# Patient Record
Sex: Male | Born: 1950 | Race: White | Hispanic: No | Marital: Married | Smoking: Never smoker
Health system: Southern US, Community
[De-identification: ages and names within clinical notes are randomized; demographics above are authoritative.]

## PROBLEM LIST (undated history)

## (undated) DIAGNOSIS — E785 Hyperlipidemia, unspecified: Secondary | ICD-10-CM

## (undated) DIAGNOSIS — I10 Essential (primary) hypertension: Secondary | ICD-10-CM

---

## 1997-12-20 ENCOUNTER — Ambulatory Visit (HOSPITAL_COMMUNITY): Admission: RE | Admit: 1997-12-20 | Discharge: 1997-12-20 | Payer: Self-pay | Admitting: Endocrinology

## 1998-07-21 ENCOUNTER — Encounter: Payer: Self-pay | Admitting: Emergency Medicine

## 1998-07-21 ENCOUNTER — Emergency Department (HOSPITAL_COMMUNITY): Admission: EM | Admit: 1998-07-21 | Discharge: 1998-07-21 | Payer: Self-pay | Admitting: Emergency Medicine

## 2003-10-06 ENCOUNTER — Ambulatory Visit (HOSPITAL_COMMUNITY): Admission: RE | Admit: 2003-10-06 | Discharge: 2003-10-06 | Payer: Self-pay | Admitting: Endocrinology

## 2011-08-24 ENCOUNTER — Inpatient Hospital Stay (HOSPITAL_COMMUNITY)
Admission: EM | Admit: 2011-08-24 | Discharge: 2011-08-26 | DRG: 141 | Disposition: A | Discharge status: Home / Self Care | Payer: BC Managed Care – PPO | Source: Ambulatory Visit | Attending: Internal Medicine | Admitting: Internal Medicine

## 2011-08-24 ENCOUNTER — Other Ambulatory Visit: Payer: Self-pay

## 2011-08-24 ENCOUNTER — Encounter (HOSPITAL_COMMUNITY): Payer: Self-pay | Admitting: *Deleted

## 2011-08-24 DIAGNOSIS — I10 Essential (primary) hypertension: Secondary | ICD-10-CM | POA: Diagnosis present

## 2011-08-24 DIAGNOSIS — E1039 Type 1 diabetes mellitus with other diabetic ophthalmic complication: Secondary | ICD-10-CM | POA: Diagnosis present

## 2011-08-24 DIAGNOSIS — E785 Hyperlipidemia, unspecified: Secondary | ICD-10-CM | POA: Diagnosis present

## 2011-08-24 DIAGNOSIS — R55 Syncope and collapse: Principal | ICD-10-CM | POA: Diagnosis present

## 2011-08-24 DIAGNOSIS — E860 Dehydration: Secondary | ICD-10-CM | POA: Diagnosis present

## 2011-08-24 DIAGNOSIS — R739 Hyperglycemia, unspecified: Secondary | ICD-10-CM

## 2011-08-24 DIAGNOSIS — Z7982 Long term (current) use of aspirin: Secondary | ICD-10-CM

## 2011-08-24 DIAGNOSIS — E109 Type 1 diabetes mellitus without complications: Secondary | ICD-10-CM | POA: Diagnosis present

## 2011-08-24 DIAGNOSIS — Z9641 Presence of insulin pump (external) (internal): Secondary | ICD-10-CM

## 2011-08-24 DIAGNOSIS — H409 Unspecified glaucoma: Secondary | ICD-10-CM | POA: Diagnosis present

## 2011-08-24 DIAGNOSIS — Z79899 Other long term (current) drug therapy: Secondary | ICD-10-CM

## 2011-08-24 DIAGNOSIS — E11319 Type 2 diabetes mellitus with unspecified diabetic retinopathy without macular edema: Secondary | ICD-10-CM | POA: Diagnosis present

## 2011-08-24 DIAGNOSIS — Z6838 Body mass index (BMI) 38.0-38.9, adult: Secondary | ICD-10-CM

## 2011-08-24 DIAGNOSIS — Z794 Long term (current) use of insulin: Secondary | ICD-10-CM

## 2011-08-24 DIAGNOSIS — E669 Obesity, unspecified: Secondary | ICD-10-CM | POA: Diagnosis present

## 2011-08-24 HISTORY — DX: Essential (primary) hypertension: I10

## 2011-08-24 HISTORY — DX: Hyperlipidemia, unspecified: E78.5

## 2011-08-24 LAB — GLUCOSE, CAPILLARY
Glucose-Capillary: 272 mg/dL — ABNORMAL HIGH (ref 70–99)
Glucose-Capillary: 233 mg/dL — ABNORMAL HIGH (ref 70–99)

## 2011-08-24 LAB — CBC
HCT: 35.7 % — ABNORMAL LOW (ref 39.0–52.0)
Hemoglobin: 12.5 g/dL — ABNORMAL LOW (ref 13.0–17.0)
MCH: 29.1 pg (ref 26.0–34.0)
MCHC: 35 g/dL (ref 30.0–36.0)
MCV: 83 fL (ref 78.0–100.0)
Platelets: 225 10*3/uL (ref 150–400)
RBC: 4.3 MIL/uL (ref 4.22–5.81)
RDW: 12.6 % (ref 11.5–15.5)
WBC: 12.7 10*3/uL — ABNORMAL HIGH (ref 4.0–10.5)

## 2011-08-24 LAB — BASIC METABOLIC PANEL
BUN: 14 mg/dL (ref 6–23)
CO2: 26 mEq/L (ref 19–32)
Calcium: 9 mg/dL (ref 8.4–10.5)
Chloride: 100 mEq/L (ref 96–112)
Creatinine, Ser: 0.73 mg/dL (ref 0.50–1.35)
GFR calc Af Amer: >90 mL/min (ref >90)
GFR calc non Af Amer: >90 mL/min (ref >90)
Glucose, Bld: 351 mg/dL — ABNORMAL HIGH (ref 70–99)
Potassium: 4.6 mEq/L (ref 3.5–5.1)
Sodium: 134 mEq/L — ABNORMAL LOW (ref 135–145)

## 2011-08-24 LAB — DIFFERENTIAL
Basophils Absolute: 0 10*3/uL (ref 0.0–0.1)
Basophils Relative: 0 % (ref 0–1)
Eosinophils Absolute: 0.1 10*3/uL (ref 0.0–0.7)
Eosinophils Relative: 1 % (ref 0–5)
Lymphocytes Relative: 8 % — ABNORMAL LOW (ref 12–46)
Lymphs Abs: 1 10*3/uL (ref 0.7–4.0)
Monocytes Absolute: 0.5 10*3/uL (ref 0.1–1.0)
Monocytes Relative: 4 % (ref 3–12)
Neutro Abs: 11.1 10*3/uL — ABNORMAL HIGH (ref 1.7–7.7)
Neutrophils Relative %: 87 % — ABNORMAL HIGH (ref 43–77)

## 2011-08-24 MED ORDER — BRIMONIDINE TARTRATE 0.2 % OP SOLN
1.0000 [drp] | Freq: Two times a day (BID) | OPHTHALMIC | Status: DC
  Administered 2011-08-25 – 2011-08-26 (×4): 1 [drp] via OPHTHALMIC
  Filled 2011-08-24: qty 5

## 2011-08-24 MED ORDER — INSULIN ASPART 100 UNIT/ML ~~LOC~~ SOLN
0.0000 [IU] | Freq: Three times a day (TID) | SUBCUTANEOUS | Status: DC
  Filled 2011-08-24: qty 3

## 2011-08-24 MED ORDER — SODIUM CHLORIDE 0.9 % IJ SOLN
3.0000 mL | Freq: Two times a day (BID) | INTRAMUSCULAR | Status: DC
  Administered 2011-08-25: 3 mL via INTRAVENOUS

## 2011-08-24 MED ORDER — METFORMIN HCL 500 MG PO TABS
1000.0000 mg | ORAL_TABLET | Freq: Two times a day (BID) | ORAL | Status: DC
  Administered 2011-08-24: 1000 mg via ORAL
  Filled 2011-08-24 (×2): qty 2

## 2011-08-24 MED ORDER — SODIUM CHLORIDE 0.9 % IV SOLN: INTRAVENOUS | Status: DC

## 2011-08-24 MED ORDER — INSULIN GLULISINE 100 UNIT/ML ~~LOC~~ SOLN: 1.0000 [IU] | Freq: Three times a day (TID) | SUBCUTANEOUS | Status: DC

## 2011-08-24 MED ORDER — OMEGA-3-ACID ETHYL ESTERS 1 G PO CAPS
1.0000 g | ORAL_CAPSULE | Freq: Two times a day (BID) | ORAL | Status: DC
  Administered 2011-08-24 – 2011-08-26 (×4): 1 g via ORAL
  Filled 2011-08-24 (×6): qty 1

## 2011-08-24 MED ORDER — LATANOPROST 0.005 % OP SOLN
1.0000 [drp] | Freq: Every day | OPHTHALMIC | Status: DC
  Administered 2011-08-24 – 2011-08-25 (×3): 1 [drp] via OPHTHALMIC
  Filled 2011-08-24: qty 2.5

## 2011-08-24 MED ORDER — INSULIN PUMP
Freq: Three times a day (TID) | SUBCUTANEOUS | Status: DC
  Administered 2011-08-25: 6 via SUBCUTANEOUS
  Administered 2011-08-25: 4 via SUBCUTANEOUS
  Administered 2011-08-25: 6 via SUBCUTANEOUS
  Administered 2011-08-25: 6.25 via SUBCUTANEOUS
  Administered 2011-08-26: 1.25 via SUBCUTANEOUS

## 2011-08-24 MED ORDER — SITAGLIPTIN-METFORMIN HCL 50-1000 MG PO TABS
1.0000 | ORAL_TABLET | Freq: Two times a day (BID) | ORAL | Status: DC
Start: 2011-08-24 — End: 2011-08-24

## 2011-08-24 MED ORDER — INSULIN ASPART 100 UNIT/ML ~~LOC~~ SOLN
8.0000 [IU] | Freq: Once | SUBCUTANEOUS | Status: AC
  Administered 2011-08-24: 8 [IU] via INTRAVENOUS

## 2011-08-24 MED ORDER — SODIUM CHLORIDE 0.9 % IV SOLN
INTRAVENOUS | Status: DC
  Administered 2011-08-24 – 2011-08-25 (×3): via INTRAVENOUS

## 2011-08-24 MED ORDER — METFORMIN HCL 500 MG PO TABS
1000.0000 mg | ORAL_TABLET | Freq: Two times a day (BID) | ORAL | Status: DC
  Administered 2011-08-25 – 2011-08-26 (×3): 1000 mg via ORAL
  Filled 2011-08-24 (×6): qty 2

## 2011-08-24 MED ORDER — PANTOPRAZOLE SODIUM 40 MG PO TBEC
40.0000 mg | DELAYED_RELEASE_TABLET | Freq: Every day | ORAL | Status: DC
  Administered 2011-08-25 – 2011-08-26 (×2): 40 mg via ORAL
  Filled 2011-08-24 (×2): qty 1

## 2011-08-24 MED ORDER — VITAMIN D (ERGOCALCIFEROL) 1.25 MG (50000 UNIT) PO CAPS
50000.0000 [IU] | ORAL_CAPSULE | ORAL | Status: DC
  Administered 2011-08-25: 50000 [IU] via ORAL
  Filled 2011-08-24: qty 1

## 2011-08-24 MED ORDER — SIMVASTATIN 40 MG PO TABS
40.0000 mg | ORAL_TABLET | Freq: Every day | ORAL | Status: DC
  Administered 2011-08-25: 40 mg via ORAL
  Filled 2011-08-24 (×2): qty 1

## 2011-08-24 MED ORDER — LINAGLIPTIN 5 MG PO TABS
5.0000 mg | ORAL_TABLET | Freq: Every day | ORAL | Status: DC
  Administered 2011-08-24 – 2011-08-26 (×3): 5 mg via ORAL
  Filled 2011-08-24 (×4): qty 1

## 2011-08-24 MED ORDER — TIMOLOL MALEATE 0.5 % OP SOLN
1.0000 [drp] | Freq: Two times a day (BID) | OPHTHALMIC | Status: DC
  Administered 2011-08-25 – 2011-08-26 (×3): 1 [drp] via OPHTHALMIC
  Filled 2011-08-24: qty 5

## 2011-08-24 MED ORDER — DEXTROSE 50 % IV SOLN
INTRAVENOUS | Status: AC
  Filled 2011-08-24: qty 50

## 2011-08-24 MED ORDER — SODIUM CHLORIDE 0.9 % IV SOLN: 250.0000 mL | INTRAVENOUS | Status: DC | PRN

## 2011-08-24 MED ORDER — SODIUM CHLORIDE 0.9 % IJ SOLN: 3.0000 mL | Freq: Two times a day (BID) | INTRAMUSCULAR | Status: DC

## 2011-08-24 MED ORDER — ENOXAPARIN SODIUM 40 MG/0.4ML ~~LOC~~ SOLN
40.0000 mg | SUBCUTANEOUS | Status: DC
  Administered 2011-08-24 – 2011-08-25 (×2): 40 mg via SUBCUTANEOUS
  Filled 2011-08-24 (×3): qty 0.4

## 2011-08-24 MED ORDER — BRINZOLAMIDE 1 % OP SUSP
1.0000 [drp] | Freq: Two times a day (BID) | OPHTHALMIC | Status: DC
  Administered 2011-08-25 (×2): 1 [drp] via OPHTHALMIC
  Filled 2011-08-24 (×3): qty 10

## 2011-08-24 MED ORDER — LINAGLIPTIN 5 MG PO TABS
5.0000 mg | ORAL_TABLET | Freq: Every day | ORAL | Status: DC
  Filled 2011-08-24: qty 1

## 2011-08-24 MED ORDER — ADULT MULTIVITAMIN W/MINERALS CH
1.0000 | ORAL_TABLET | Freq: Every day | ORAL | Status: DC
  Administered 2011-08-25 – 2011-08-26 (×2): 1 via ORAL
  Filled 2011-08-24 (×2): qty 1

## 2011-08-24 MED ORDER — BRIMONIDINE TARTRATE-TIMOLOL 0.2-0.5 % OP SOLN: 1.0000 [drp] | Freq: Two times a day (BID) | OPHTHALMIC | Status: DC

## 2011-08-24 MED ORDER — ASPIRIN EC 81 MG PO TBEC
81.0000 mg | DELAYED_RELEASE_TABLET | Freq: Every day | ORAL | Status: DC
  Administered 2011-08-25 – 2011-08-26 (×2): 81 mg via ORAL
  Filled 2011-08-24 (×2): qty 1

## 2011-08-24 MED ORDER — OLMESARTAN MEDOXOMIL 40 MG PO TABS
40.0000 mg | ORAL_TABLET | Freq: Every day | ORAL | Status: DC
  Administered 2011-08-25 – 2011-08-26 (×2): 40 mg via ORAL
  Filled 2011-08-24 (×2): qty 1

## 2011-08-24 MED ORDER — SODIUM CHLORIDE 0.9 % IJ SOLN: 3.0000 mL | INTRAMUSCULAR | Status: DC | PRN

## 2011-08-24 NOTE — ED Provider Notes (Signed)
History     CSN: [id number]  Arrival date & time 08/24/11  1107   First MD Initiated Contact with Patient 08/24/11 1113      Chief Complaint  Patient presents with  . Near Syncope    (Consider location/radiation/quality/duration/timing/severity/associated sxs/prior treatment) The history is provided by the patient and a parent.   the patient is a 61 year old male, with a history of diabetes.  He does not have a history of coronary artery disease, irregular heartbeat, congestive heart failure.  He denies smoking.  He denies a history of peptic ulcer disease.  He has not noted any blood in his stool, urine or from his gums or nose.  He rarely drinks alcohol and only had one beer last night.  He denies recent illness.  Today.  He got up and noted that his blood sugar was approximately 300.  He got nauseated, and he fainted.  He recovered almost immediately.  He got back in the bed and then tried to stand up.  Again, he fainted again.  He fainted with once again.  The patient denies nausea at this time.  He denies vomiting, as well.  He did not have any chest pain or pain anywhere else.  Prior to falling.  He denies palpitations.  Prior to falling.  He had no warning that he would faint except for very brief change in his vision, and then, he collapsed.  No past medical history on file.  No past surgical history on file.  No family history on file.  History  Substance Use Topics  . Smoking status: Not on file  . Smokeless tobacco: Not on file  . Alcohol Use: Not on file      Review of Systems  Constitutional: Negative for fever and chills.  HENT: Negative for nosebleeds and neck pain.   Eyes: Negative for visual disturbance.  Respiratory: Negative for cough, chest tightness and shortness of breath.   Cardiovascular: Negative for chest pain, palpitations and leg swelling.  Gastrointestinal: Positive for nausea and vomiting. Negative for abdominal pain and diarrhea.       2 episodes  of nausea and vomiting  Skin: Negative for pallor and rash.  Neurological: Positive for syncope. Negative for headaches.  Psychiatric/Behavioral: Negative for confusion.  All other systems reviewed and are negative.    Allergies  Review of patient's allergies indicates not on file.  Home Medications   Current Outpatient Rx  Name Route Sig Dispense Refill  . ASPIRIN EC 81 MG PO TBEC Oral Take 81 mg by mouth daily.    . ATORVASTATIN CALCIUM 20 MG PO TABS Oral Take 20 mg by mouth daily.    Marland Kitchen BRIMONIDINE TARTRATE-TIMOLOL 0.2-0.5 % OP SOLN Both Eyes Place 1 drop into both eyes every 12 (twelve) hours.    Marland Kitchen BRINZOLAMIDE 1 % OP SUSP Left Eye Place 1 drop into the left eye 2 (two) times daily.    Marland Kitchen ESOMEPRAZOLE MAGNESIUM 40 MG PO CPDR Oral Take 40 mg by mouth every other day.    . INSULIN GLULISINE 100 UNIT/ML [place] SOLN Subcutaneous Inject 1-16 Units into the skin 3 (three) times daily before meals. 65-75 units per day - on a sliding scale -  1 unit of insulin for every 25 points over 100.    Marland Kitchen LATANOPROST 0.005 % OP SOLN Both Eyes Place 1 drop into both eyes at bedtime.    . ADULT MULTIVITAMIN W/MINERALS CH Oral Take 1 tablet by mouth daily. Centrum silver    .  OMEGA-3-ACID ETHYL ESTERS 1 G PO CAPS Oral Take 1 g by mouth 2 (two) times daily.    Marland Kitchen SITAGLIPTIN-METFORMIN HCL 50-1000 MG PO TABS Oral Take 1 tablet by mouth 2 (two) times daily with a meal.    . VALSARTAN 320 MG PO TABS Oral Take 320 mg by mouth daily.    Marland Kitchen VITAMIN D (ERGOCALCIFEROL) 50000 UNITS PO CAPS Oral Take 50,000 Units by mouth every 14 (fourteen) days. Dose due February 3rd      BP 124/67  Pulse 92  Temp(Src) 97.9 F (36.6 C) (Oral)  Resp 18  SpO2 99%  Physical Exam  Constitutional: He is oriented to person, place, and time.       Morbidly obese  HENT:  Head: Normocephalic and atraumatic.  Eyes: Conjunctivae are normal. Pupils are equal, round, and reactive to light.  Neck: Normal range of motion. Neck supple.        No carotid bruits  Cardiovascular: Normal rate and regular rhythm.   No murmur heard. Pulmonary/Chest: Effort normal and breath sounds normal. No respiratory distress.  Abdominal: Soft. He exhibits no distension. There is no tenderness.  Musculoskeletal: Normal range of motion. He exhibits no edema and no tenderness.  Neurological: He is alert and oriented to person, place, and time. He has normal strength. He displays no tremor. No cranial nerve deficit or sensory deficit. He displays no seizure activity. Coordination normal. GCS eye subscore is 4. GCS verbal subscore is 5. GCS motor subscore is 6.  Skin: Skin is warm and dry.  Psychiatric: He has a normal mood and affect. His behavior is normal. Judgment and thought content normal.    ED Course  Procedures (including critical care time)  Labs Reviewed  CBC - Abnormal; Notable for the following:    WBC 12.7 (*)    Hemoglobin 12.5 (*)    HCT 35.7 (*)    All other components within normal limits  DIFFERENTIAL - Abnormal; Notable for the following:    Neutrophils Relative 87 (*)    Neutro Abs 11.1 (*)    Lymphocytes Relative 8 (*)    All other components within normal limits  BASIC METABOLIC PANEL   No results found.   No diagnosis found.  12:23 PM Spoke with triad.  He will admit.   MDM  Syncope 61 year old male, with no history of coronary disease, congestive heart failure or arrhythmia presents with 3 episodes of syncope today.  His symptoms, come, without warning.  Each time.  He thinks he has a rapid recovery of consciousness.  He has no symptoms to suggest an etiology for his syncope.  His physical examination is normal except for obesity.  No evidence of aortic stenosis.  There are no carotid bruits.  He's got a completely normal.  Neurological examination.        Nicholes Stairs, MD 08/24/11 [phone]

## 2011-08-24 NOTE — H&P (Signed)
PCP:  informs seeing Dr Tanja Port with [facility]   DOA:  08/24/2011 11:07 AM  Chief Complaint:  Syncope x 1 day  HPI: 61 y/o obese male with hx of type 1 DM on insulin pump with diabetic retinopathy, HL, presented with 3 episodes of  Syncope. Patient was in his usual state of health this morning when he woke up to go to the bathroom and synopsized for about 15 seconds. Denies any prodromal symptoms. He denies hitting his head. When he woke up, he was on the ground. He sat down for abut 45 minute and then got up to go to the bathroom and then again synopsized for about 15-20 seconds. He felt a little dizzy then and after few minutes tried to move around while sitting down but became nauseous and had an episode of vomiting. The 3rd episode was witnessed by his wife. EMS was called and as per patient and his wife his orthostatic BP  was checked but they aren't sure what it was. Patient denies any chest pain, palpitations, SOB, headache or weakness of limbs. Denies any seizure like activity. He informs feeling little dizzy . He denies any bowel  or urinary incontinence. Denies similar symptoms in past. Denies any bowel or urinary symptoms. informs having regular meals. His fsg when checked during these episodes were in high 200s . He informs having a beer last evening.denies any new mediations. Allergies: No Known Allergies  Prior to Admission medications   Medication Sig Start Date End Date Taking? Authorizing Provider  aspirin EC 81 MG tablet Take 81 mg by mouth daily.   Yes Historical Provider, MD  atorvastatin (LIPITOR) 20 MG tablet Take 20 mg by mouth daily.   Yes Historical Provider, MD  brimonidine-timolol (COMBIGAN) 0.2-0.5 % ophthalmic solution Place 1 drop into both eyes every 12 (twelve) hours.   Yes Historical Provider, MD  brinzolamide (AZOPT) 1 % ophthalmic suspension Place 1 drop into the left eye 2 (two) times daily.   Yes Historical Provider, MD  esomeprazole (NEXIUM) 40 MG capsule Take 40  mg by mouth every other day.   Yes Historical Provider, MD  insulin glulisine (APIDRA) 100 UNIT/ML injection Inject 1-16 Units into the skin 3 (three) times daily before meals. 65-75 units per day - on a sliding scale -  1 unit of insulin for every 25 points over 100.   Yes Historical Provider, MD  latanoprost (XALATAN) 0.005 % ophthalmic solution Place 1 drop into both eyes at bedtime.   Yes Historical Provider, MD  Multiple Vitamin (MULITIVITAMIN WITH MINERALS) TABS Take 1 tablet by mouth daily. Centrum silver   Yes Historical Provider, MD  omega-3 acid ethyl esters (LOVAZA) 1 G capsule Take 1 g by mouth 2 (two) times daily.   Yes Historical Provider, MD  sitaGLIPtan-metformin (JANUMET) 50-1000 MG per tablet Take 1 tablet by mouth 2 (two) times daily with a meal.   Yes Historical Provider, MD  valsartan (DIOVAN) 320 MG tablet Take 320 mg by mouth daily.   Yes Historical Provider, MD  Vitamin D, Ergocalciferol, (DRISDOL) 50000 UNITS CAPS Take 50,000 Units by mouth every 14 (fourteen) days. Dose due February 3rd   Yes Historical Provider, MD    Past Medical History  Diagnosis Date  . Diabetes mellitus   . Hypertension   . Glaucoma   . Hyperlipemia     No past surgical history on file.  Social History:  does not have a smoking history on file. He does not have any smokeless  tobacco history on file. He reports that he does not drink alcohol or use illicit drugs.  History reviewed. No pertinent family history.  Review of Systems:  Constitutional: Denies fever, chills, diaphoresis, appetite change and fatigue.  HEENT: Denies photophobia, eye pain, redness, hearing loss, ear pain, congestion, sore throat, rhinorrhea, sneezing, mouth sores, trouble swallowing, neck pain, neck stiffness and tinnitus.   Respiratory: Denies SOB, DOE, cough, chest tightness,  and wheezing.   Cardiovascular: Denies chest pain, palpitations and leg swelling.  Gastrointestinal: nausea+, vomiting+, denies  abdominal  pain, diarrhea, constipation, blood in stool and abdominal distention.  Genitourinary: Denies dysuria, urgency, frequency, hematuria, flank pain and difficulty urinating.  Musculoskeletal: Denies myalgias, back pain, joint swelling, arthralgias and gait problem.  Skin: Denies pallor, rash and wound.  Neurological: syncope, mild  dizziness, seizures, syncope, weakness, light-headedness, numbness and headaches.  Hematological: Denies adenopathy. Easy bruising, personal or family bleeding history  Psychiatric/Behavioral: Denies suicidal ideation, mood changes, confusion, nervousness, sleep disturbance and agitation   Physical Exam:  Filed Vitals:   08/24/11 1300 08/24/11 1315 08/24/11 1330 08/24/11 1405  BP: 120/70 113/72 149/78 133/75  Pulse: 96 99 101 101  Temp:    98 F (36.7 C)  TempSrc:    Oral  Resp: 20 10 16 18   Height:    5' 10.75" (1.797 m)  Weight:    122.471 kg (270 lb)  SpO2: 96% 96% 98% 96%    Constitutional: Vital signs reviewed. Obese male lying in bed in NAD HEENT: no pallor, moist oral mucosa, no LAD Cardiovascular: RRR, S1 normal, S2 normal, no MRG, pulses symmetric and intact bilaterally Pulmonary/Chest: CTAB, no wheezes, rales, or rhonchi Abdominal: Obese, Soft, Non-tender, non-distended, bowel sounds are normal, no masses, organomegaly, or guarding present.  GU: no CVA tenderness Musculoskeletal: No joint deformities, erythema, or stiffness, ROM full and no nontender Ext: no edema and no cyanosis, pulses palpable bilaterally (DP and PT) Hematology: no cervical, inginal, or axillary adenopathy.  Neurological: A&O x3, Strenght is normal and symmetric bilaterally, cranial nerve II-XII are grossly intact, no focal motor deficit, sensory intact to light touch bilaterally. cerebellar fn intact.  Skin: Warm, dry and intact. No rash, cyanosis, or clubbing.  Psychiatric: Normal mood and affect. speech and behavior is normal. Judgment and thought content normal. Cognition  and memory are normal.   Labs on Admission:  Results for orders placed during the hospital encounter of 08/24/11 (from the past 48 hour(s))  CBC     Status: Abnormal   Collection Time   08/24/11 11:15 AM      Component Value Range Comment   WBC 12.7 (*) 4.0 - 10.5 (K/uL)    RBC 4.30  4.22 - 5.81 (MIL/uL)    Hemoglobin 12.5 (*) 13.0 - 17.0 (g/dL)    HCT 95.2 (*) 84.1 - 52.0 (%)    MCV 83.0  78.0 - 100.0 (fL)    MCH 29.1  26.0 - 34.0 (pg)    MCHC 35.0  30.0 - 36.0 (g/dL)    RDW 32.4  40.1 - 02.7 (%)    Platelets 225  150 - 400 (K/uL)   DIFFERENTIAL     Status: Abnormal   Collection Time   08/24/11 11:15 AM      Component Value Range Comment   Neutrophils Relative 87 (*) 43 - 77 (%)    Neutro Abs 11.1 (*) 1.7 - 7.7 (K/uL)    Lymphocytes Relative 8 (*) 12 - 46 (%)    Lymphs Abs 1.0  0.7 - 4.0 (K/uL)    Monocytes Relative 4  3 - 12 (%)    Monocytes Absolute 0.5  0.1 - 1.0 (K/uL)    Eosinophils Relative 1  0 - 5 (%)    Eosinophils Absolute 0.1  0.0 - 0.7 (K/uL)    Basophils Relative 0  0 - 1 (%)    Basophils Absolute 0.0  0.0 - 0.1 (K/uL)   BASIC METABOLIC PANEL     Status: Abnormal   Collection Time   08/24/11 11:15 AM      Component Value Range Comment   Sodium 134 (*) 135 - 145 (mEq/L)    Potassium 4.6  3.5 - 5.1 (mEq/L)    Chloride 100  96 - 112 (mEq/L)    CO2 26  19 - 32 (mEq/L)    Glucose, Bld 351 (*) 70 - 99 (mg/dL)    BUN 14  6 - 23 (mg/dL)    Creatinine, Ser 0.93  0.50 - 1.35 (mg/dL)    Calcium 9.0  8.4 - 10.5 (mg/dL)    GFR calc non Af Amer >90  >90 (mL/min)    GFR calc Af Amer >90  >90 (mL/min)   GLUCOSE, CAPILLARY     Status: Abnormal   Collection Time   08/24/11  1:23 PM      Component Value Range Comment   Glucose-Capillary 272 (*) 70 - 99 (mg/dL)    Comment 1 Notify RN      Comment 2 Documented in Chart       Radiological Exams on Admission: No results found.  EKG: NSR@95 , RAD, no ST-T changes . No old  EKG to compare Assessment  Syncope and collapse   admit to telemetry  Unclear if orthostasis was truly negative, will recheck it EKG shows RAD otherwise wnl, HR in low 100s on tele with few PVCs, will check mg level Will check for cardiac enzyme  symptoms very likely due to dehydration or cardiogenic syncope.  Continue with IV fluids Will get 2D echo and monitor on tele for next 24 hrs and see for any abnormality PT eval Fall precautions  Diabetes mellitus type 1 uncontrolled  on insulin pump informs his A1C to be in above 10  will consult diabetic educator Send A1C and lipid panel Cont statin  Hypertension Switch diovan to benicar  DVT prophylaxis:  sq lovenox  Diet: diabetic  Time Spent on Admission: 45 minutes  Catlynn Grondahl 08/24/2011, 3:44 PM

## 2011-08-24 NOTE — Progress Notes (Signed)
2200 CBG per unit meter 91....glucose per pts implanted meter 108.  pt provided HS snack and glucose per implanted meter continued to drop.  Over span of 11/2 hour glucose down to 58 and pt became symptomatic with shaking. Pt given 1/2 amp D50 and glucose back up to 138 per unit meter.  Will recheck CBG and continue to monitor. Dierdre Highman

## 2011-08-24 NOTE — Progress Notes (Signed)
I admitted pt this afternoon from ED.  He has had the following of Apidra Insulin (from his insulin pump): at 12:11 (4 units), at 13:05 (4 units), at 16:00 (5 units), at 17:07 (3 units), at 18:48 (6 units).

## 2011-08-24 NOTE — ED Notes (Signed)
X 3 syncopal episodes this morning. Caught his fall each time. Pt. Been vomiting. cbg 270. Pt. Exp. X 1 syncopal episode with ems, which lasted 10 seconds.

## 2011-08-24 NOTE — ED Notes (Signed)
Pt. Has an insulin pump with continuous monitoring.

## 2011-08-25 ENCOUNTER — Encounter (HOSPITAL_COMMUNITY): Payer: Self-pay | Admitting: Cardiology

## 2011-08-25 ENCOUNTER — Other Ambulatory Visit: Payer: Self-pay

## 2011-08-25 DIAGNOSIS — R55 Syncope and collapse: Secondary | ICD-10-CM | POA: Diagnosis present

## 2011-08-25 LAB — LIPID PANEL
HDL: 38 mg/dL — ABNORMAL LOW (ref >39)
LDL Cholesterol: 34 mg/dL (ref 0–99)
Total CHOL/HDL Ratio: 2.6 RATIO
VLDL: 26 mg/dL (ref 0–40)

## 2011-08-25 LAB — GLUCOSE, CAPILLARY
Glucose-Capillary: 88 mg/dL (ref 70–99)
Glucose-Capillary: 101 mg/dL — ABNORMAL HIGH (ref 70–99)
Glucose-Capillary: 165 mg/dL — ABNORMAL HIGH (ref 70–99)

## 2011-08-25 NOTE — Consults (Signed)
Admit date: 08/24/2011 Referring Physician  Dr. Gonzella Lex Reason for Consultation  syncope  HPI: This is a 61yo male with history of DM, HTN and dylipidemia who presented to the ER with 3 syncopal episodes.  Apparently he had been in his USOH and got up to go to the bathroom and had syncope for about 15 seconds.  He denied any chest pain, SOB, palpitations, diaphoresis or nausea before the event.  When he woke up he found himself on the ground.  He says that he went into the next room and sat in a chair for about  45 minutes and then got up to go to the bathroom and had syncope for about 15 seconds again.  He then felt dizzy and tried to move around while sitting.  He then became nauseated and had vomiting.  He then passed out again and this episode was witnessed by his wife.  EMS was called.  He had no witnessed seizure activity.  His fasting BS was 200.  We are now asked to consult.     PMH:   Past Medical History  Diagnosis Date  . Diabetes mellitus   . Hypertension   . Glaucoma   . Hyperlipemia      PSH:  History reviewed. No pertinent past surgical history.  Allergies:  Review of patient's allergies indicates no known allergies. Prior to Admit Meds:   Prescriptions prior to admission  Medication Sig Dispense Refill  . aspirin EC 81 MG tablet Take 81 mg by mouth daily.      Marland Kitchen atorvastatin (LIPITOR) 20 MG tablet Take 20 mg by mouth daily.      . brimonidine-timolol (COMBIGAN) 0.2-0.5 % ophthalmic solution Place 1 drop into both eyes every 12 (twelve) hours.      . brinzolamide (AZOPT) 1 % ophthalmic suspension Place 1 drop into the left eye 2 (two) times daily.      Marland Kitchen esomeprazole (NEXIUM) 40 MG capsule Take 40 mg by mouth every other day.      . insulin glulisine (APIDRA) 100 UNIT/ML injection Inject 1-16 Units into the skin 3 (three) times daily before meals. 65-75 units per day - on a sliding scale -  1 unit of insulin for every 25 points over 100.      . latanoprost (XALATAN) 0.005 %  ophthalmic solution Place 1 drop into both eyes at bedtime.      . Multiple Vitamin (MULITIVITAMIN WITH MINERALS) TABS Take 1 tablet by mouth daily. Centrum silver      . omega-3 acid ethyl esters (LOVAZA) 1 G capsule Take 1 g by mouth 2 (two) times daily.      . sitaGLIPtan-metformin (JANUMET) 50-1000 MG per tablet Take 1 tablet by mouth 2 (two) times daily with a meal.      . valsartan (DIOVAN) 320 MG tablet Take 320 mg by mouth daily.      . Vitamin D, Ergocalciferol, (DRISDOL) 50000 UNITS CAPS Take 50,000 Units by mouth every 14 (fourteen) days. Dose due February 3rd       Fam HX:   History reviewed. No pertinent family history. Social HX:    History   Social History  . Marital Status: Married    Spouse Name: N/A    Number of Children: N/A  . Years of Education: N/A   Occupational History  . Not on file.   Social History Main Topics  . Smoking status: Never Smoker   . Smokeless tobacco: Not on file  . Alcohol  Use: No  . Drug Use: No  . Sexually Active:    Other Topics Concern  . Not on file   Social History Narrative  . No narrative on file     ROS:  All 11 ROS were addressed and are negative except what is stated in the HPI  Physical Exam: Blood pressure 135/76, pulse 94, temperature 98.2 F (36.8 C), temperature source Oral, resp. rate 18, height 5' 10.75" (1.797 m), weight 122.471 kg (270 lb), SpO2 93.00%.    General: Well developed, well nourished, in no acute distress Head: Eyes PERRLA, No xanthomas.   Normal cephalic and atramatic  Lungs:   Clear bilaterally to auscultation and percussion. Heart:   HRRR S1 S2 Pulses are 2+ & equal.            No carotid bruit. No JVD.  No abdominal bruits. No femoral bruits. Abdomen: Bowel sounds are positive, abdomen soft and non-tender without masses  Extremities:   No clubbing, cyanosis or edema.  DP +1 Neuro: Alert and oriented X 3. Psych:  Good affect, responds appropriately    Labs:   Lab Results  Component Value  Date   WBC 12.7* 08/24/2011   HGB 12.5* 08/24/2011   HCT 35.7* 08/24/2011   MCV 83.0 08/24/2011   PLT 225 08/24/2011    Lab 08/24/11 1115  NA 134*  K 4.6  CL 100  CO2 26  BUN 14  CREATININE 0.73  CALCIUM 9.0  PROT --  BILITOT --  ALKPHOS --  ALT --  AST --  GLUCOSE 351*     Lab Results  Component Value Date   TROPONINI <0.30 08/24/2011     Lab Results  Component Value Date   CHOL 98 08/25/2011   Lab Results  Component Value Date   HDL 38* 08/25/2011   Lab Results  Component Value Date   LDLCALC 34 08/25/2011   Lab Results  Component Value Date   TRIG 132 08/25/2011   Lab Results  Component Value Date   CHOLHDL 2.6 08/25/2011   No results found for this basename: LDLDIRECT      Radiology:  NONE EKG:  NSR with normal QTc  ASSESSMENT:  1.  Syncope x 3 - sounds orthostatic, patient was mildly tachycardic on admit.  EKG nonischemic and troponin negative. 2.  DM 3.  HTN  PLAN:   1.  Check 2D echo to eval LVF - if normal then would d/c home on [employer] heart monitor for 4 weeks and schedule outpatient stress cardiolyte to rule out ischemia 2.  Check carotid arterial dopplers Quintella Reichert, MD  08/25/2011  11:25 AM

## 2011-08-25 NOTE — Progress Notes (Signed)
Subjective: Patient seen and examined this am. No overnight issues  Objective:  Vital signs in last 24 hours:  Filed Vitals:   08/24/11 1907 08/24/11 2100 08/25/11 0600 08/25/11 1349  BP: 148/82 115/68 135/76 130/74  Pulse:  86 94 90  Temp:  97.8 F (36.6 C) 98.2 F (36.8 C) 98 F (36.7 C)  TempSrc:    Oral  Resp:  14 18 19   Height:      Weight:      SpO2:  95% 93% 97%    Intake/Output from previous day:   Intake/Output Summary (Last 24 hours) at 08/25/11 1638 Last data filed at 08/25/11 1500  Gross per 24 hour  Intake   1750 ml  Output      0 ml  Net   1750 ml    Physical Exam:  Obese male lying in bed in NAD  HEENT: no pallor, moist oral mucosa, no LAD  Cardiovascular: RRR, S1 normal, S2 normal, no MRG, pulses symmetric and intact bilaterally  Pulmonary/Chest: CTAB, no wheezes, rales, or rhonchi  Abdominal: Obese, Soft, Non-tender, non-distended, bowel sounds are normal, no masses, organomegaly, or guarding present.  Ext: no edema and no cyanosis, pulses palpable bilaterally (DP and PT)  Neurological: A&O x3, Strenght is normal and symmetric bilaterally, cranial nerve II-XII are grossly intact, no focal motor deficit, sensory intact to light touch bilaterally. cerebellar fn intact.      Lab Results:  Basic Metabolic Panel:    Component Value Date/Time   NA 134* 08/24/2011 1115   K 4.6 08/24/2011 1115   CL 100 08/24/2011 1115   CO2 26 08/24/2011 1115   BUN 14 08/24/2011 1115   CREATININE 0.73 08/24/2011 1115   GLUCOSE 351* 08/24/2011 1115   CALCIUM 9.0 08/24/2011 1115   CBC:    Component Value Date/Time   WBC 12.7* 08/24/2011 1115   HGB 12.5* 08/24/2011 1115   HCT 35.7* 08/24/2011 1115   PLT 225 08/24/2011 1115   MCV 83.0 08/24/2011 1115   NEUTROABS 11.1* 08/24/2011 1115   LYMPHSABS 1.0 08/24/2011 1115   MONOABS 0.5 08/24/2011 1115   EOSABS 0.1 08/24/2011 1115   BASOSABS 0.0 08/24/2011 1115    No results found for this or any previous visit (from the past 240  hour(s)).  Studies/Results: No results found.  Medications: Scheduled Meds:   . aspirin EC  81 mg Oral Daily  . timolol  1 drop Both Eyes BID   And  . brimonidine  1 drop Both Eyes BID  . brinzolamide  1 drop Left Eye BID  . dextrose      . enoxaparin  40 mg Subcutaneous Q24H  . insulin pump   Subcutaneous TID AC, HS, 0200  . latanoprost  1 drop Both Eyes QHS  . metFORMIN  1,000 mg Oral BID WC   And  . linagliptin  5 mg Oral Q breakfast  . mulitivitamin with minerals  1 tablet Oral Daily  . olmesartan  40 mg Oral Daily  . omega-3 acid ethyl esters  1 g Oral BID  . pantoprazole  40 mg Oral Q1200  . simvastatin  40 mg Oral q1800  . sodium chloride  3 mL Intravenous Q12H  . Vitamin D (Ergocalciferol)  50,000 Units Oral Q14 Days  . DISCONTD: insulin aspart  0-20 Units Subcutaneous TID WC  . DISCONTD: insulin glulisine  1-16 Units Subcutaneous TID AC  . DISCONTD: linagliptin  5 mg Oral Q breakfast  . DISCONTD: metFORMIN  1,000  mg Oral BID WC   Continuous Infusions:   . sodium chloride 100 mL/hr at 08/24/11 1650   PRN Meds:.sodium chloride, sodium chloride Assessment  61 y/o obese male with hx of type 1 DM on insulin pump with diabetic retinopathy, HL, presented with 3 episodes of Syncope   PLAN: Syncope and collapse  admit to telemetry  orthostatis negative EKG shows RAD otherwise wnl, HR in low 100s on tele with few PVCs,  Cardiac enzyme negative symptoms very likely due to dehydration or cardiogenic syncope.  Continue with IV fluids for now continue tele. 2D echo and carotid doppler Appreciate cardiology eval. Recommend to d/c home if  LVF wnl  on lifewatch heart monitor for 4 weeks and schedule outpatient stress  to rule out ischemia PT eval   Diabetes mellitus type 1  uncontrolled  on insulin pump  informs his A1C to be in above 10  will consult diabetic educator  check A1C and lipid panel  Cont statin   Hypertension  Cont benicar  DVT prophylaxis:  sq  lovenox  Diet: diabetic    LOS: 1 day   Manuel Sullivan 08/25/2011, 4:38 PM

## 2011-08-26 DIAGNOSIS — E669 Obesity, unspecified: Secondary | ICD-10-CM | POA: Diagnosis present

## 2011-08-26 DIAGNOSIS — R55 Syncope and collapse: Secondary | ICD-10-CM

## 2011-08-26 DIAGNOSIS — E109 Type 1 diabetes mellitus without complications: Secondary | ICD-10-CM | POA: Diagnosis present

## 2011-08-26 LAB — GLUCOSE, CAPILLARY
Glucose-Capillary: 90 mg/dL (ref 70–99)
Glucose-Capillary: 116 mg/dL — ABNORMAL HIGH (ref 70–99)

## 2011-08-26 MED ORDER — INSULIN PUMP
Freq: Three times a day (TID) | SUBCUTANEOUS | Status: DC
  Filled 2011-08-26: qty 1

## 2011-08-26 NOTE — Progress Notes (Signed)
08/26/2011 [facility], Bosie Clos SPARKS Case Management Note [phone]    CARE MANAGEMENT NOTE 08/26/2011  Patient:  ZACHARI, ALBERTA   Account Number:  [id number]  Date Initiated:  08/26/2011  Documentation initiated by:  Fransico Michael  Subjective/Objective Assessment:   admitted on 08/24/11 with c/o syncope     Action/Plan:   prior to admission, patient lived at home with family support. Independent with ADLs   Anticipated DC Date:  08/26/2011   Anticipated DC Plan:  HOME/SELF CARE      DC Planning Services  CM consult      Choice offered to / List presented to:             Status of service:  Completed, signed off Medicare Important Message given?   (If response is "NO", the following Medicare IM given date fields will be blank) Date Medicare IM given:   Date Additional Medicare IM given:    Discharge Disposition:  HOME/SELF CARE  Per UR Regulation:  Reviewed for med. necessity/level of care/duration of stay  Comments:  07/26/11- [id number]-J.Lutricia Horsfall  [phone]      61yo male patient admitted on 08/24/11 with c/o syncope. Prior to admission, patient lived at home with family support. Independent with ADLs. In to see patient. To be discharged home today with self care. No discharge needs identified.

## 2011-08-26 NOTE — Progress Notes (Addendum)
Patient refused 1200 CBG check.  Will continue to monitor. Manuel Sullivan Patient now allowing finger glucose sticks.  Will continue to monitor. Manuel Sullivan

## 2011-08-26 NOTE — Progress Notes (Signed)
SUBJECTIVE:  No further syncope, wants to go home  OBJECTIVE:   Vitals:   Filed Vitals:   08/25/11 0600 08/25/11 1349 08/25/11 2100 08/26/11 0500  BP: 135/76 130/74 121/62 125/71  Pulse: 94 90 86 84  Temp: 98.2 F (36.8 C) 98 F (36.7 C) 98.7 F (37.1 C) 98.9 F (37.2 C)  TempSrc:  Oral Oral Oral  Resp: 18 19 18 18   Height:      Weight:      SpO2: 93% 97% 95% 95%   I&O's:   Intake/Output Summary (Last 24 hours) at 08/26/11 1340 Last data filed at 08/26/11 0900  Gross per 24 hour  Intake    920 ml  Output      0 ml  Net    920 ml   TELEMETRY: Reviewed telemetry pt in NSR with no arrhythmias     PHYSICAL EXAM General: Well developed, well nourished, in no acute distress Head: Eyes PERRLA, No xanthomas.   Normal cephalic and atramatic  Lungs:   Clear bilaterally to auscultation and percussion. Heart:   HRRR S1 S2 Pulses are 2+ & equal.            No carotid bruit. No JVD.  No abdominal bruits. No femoral bruits. Abdomen: Bowel sounds are positive, abdomen soft and non-tender without masses or                  Hernia's noted. Msk:  Back normal, normal gait. Normal strength and tone for age. Extremities:   No clubbing, cyanosis or edema.  DP +1 Neuro: Alert and oriented X 3. Psych:  Good affect, responds appropriately   LABS: Basic Metabolic Panel:  Basename 08/24/11 1115  NA 134*  K 4.6  CL 100  CO2 26  GLUCOSE 351*  BUN 14  CREATININE 0.73  CALCIUM 9.0  MG --  PHOS --   CBC:  Basename 08/24/11 1115  WBC 12.7*  NEUTROABS 11.1*  HGB 12.5*  HCT 35.7*  MCV 83.0  PLT 225   Cardiac Enzymes:  Basename 08/24/11 1600  CKTOTAL --  CKMB --  CKMBINDEX --  TROPONINI <0.30   Hemoglobin A1C:  Basename 08/24/11 1630  HGBA1C 9.8*   Fasting Lipid Panel:  Basename 08/25/11 0704  CHOL 98  HDL 38*  LDLCALC 34  TRIG 161  CHOLHDL 2.6  LDLDIRECT --   RADIOLOGY: No results found.    ASSESSMENT:  1. Syncope x 3 - sounds orthostatic, patient was  mildly tachycardic on admit. EKG nonischemic and troponin negative.  2. DM  3. HTN   PLAN:   1.  2D echo reviewed and LVF normal.  OK to d/c home from cardiac standpoint. 2. Will have my office call patient to schedule a stress cardiolyte in our office 3.  My office will place a lifewatch monitor on patient before d/c 4.  Followup in 2 weeks with my NP  Quintella Reichert, MD  08/26/2011  1:40 PM

## 2011-08-26 NOTE — Progress Notes (Signed)
  Echocardiogram 2D Echocardiogram has been performed.  Manuel Sullivan Lynn [name] 08/26/2011, 1:10 PM

## 2011-08-26 NOTE — Progress Notes (Signed)
DC orders received.  Patient stable with no S/S of distress.  DC and medication information reviewed with patient and wife.  Patient DC home with wife. Nolon Nations

## 2011-08-26 NOTE — Discharge Summary (Signed)
Patient ID: Manuel Sullivan MRN: [id number] DOB/AGE: 04/05/1951 61 y.o.  Admit date: 08/24/2011 Discharge date: 08/26/2011  Primary Care Physician:  Berenda Morale, MD, MD  Discharge Diagnoses:    Principal Problem:  *Syncope  Active Problems:  Diabetes mellitus type 1, uncomplicated  Obesity   Medication List  As of 08/26/2011  1:44 PM   TAKE these medications         aspirin EC 81 MG tablet   Take 81 mg by mouth daily.      atorvastatin 20 MG tablet   Commonly known as: LIPITOR   Take 20 mg by mouth daily.      brinzolamide 1 % ophthalmic suspension   Commonly known as: AZOPT   Place 1 drop into the left eye 2 (two) times daily.      COMBIGAN 0.2-0.5 % ophthalmic solution   Generic drug: brimonidine-timolol   Place 1 drop into both eyes every 12 (twelve) hours.      esomeprazole 40 MG capsule   Commonly known as: NEXIUM   Take 40 mg by mouth every other day.      insulin glulisine 100 UNIT/ML injection   Commonly known as: APIDRA   Inject 1-16 Units into the skin 3 (three) times daily before meals. 65-75 units per day - on a sliding scale -  1 unit of insulin for every 25 points over 100.      latanoprost 0.005 % ophthalmic solution   Commonly known as: XALATAN   Place 1 drop into both eyes at bedtime.      mulitivitamin with minerals Tabs   Take 1 tablet by mouth daily. Centrum silver      omega-3 acid ethyl esters 1 G capsule   Commonly known as: LOVAZA   Take 1 g by mouth 2 (two) times daily.      sitaGLIPtan-metformin 50-1000 MG per tablet   Commonly known as: JANUMET   Take 1 tablet by mouth 2 (two) times daily with a meal.      valsartan 320 MG tablet   Commonly known as: DIOVAN   Take 320 mg by mouth daily.      Vitamin D (Ergocalciferol) 50000 UNITS Caps   Commonly known as: DRISDOL   Take 50,000 Units by mouth every 14 (fourteen) days. Dose due February 3rd            Disposition and Follow-up:  Follow up with PCP in 1 week' follow up  with your endocrinologist ( [place]) as outpatient Follow up in cardiology clinic on 3/4 at 9:10 am   Consults:   Armanda Magic ( cardiology)  Significant Diagnostic Studies:  No results found.  Brief H and P: For complete details please refer to admission H and P, but in brief 61 y/o obese male with hx of type 1 DM on insulin pump with diabetic retinopathy, HL, presented with 3 episodes of Syncope. Patient was in his usual state of health this morning when he woke up to go to the bathroom and synopsized for about 15 seconds. Denies any prodromal symptoms. He denies hitting his head. When he woke up, he was on the ground. He sat down for abut 45 minute and then got up to go to the bathroom and then again synopsized for about 15-20 seconds. He felt a little dizzy then and after few minutes tried to move around while sitting down but became nauseous and had an episode of vomiting. The 3rd episode was witnessed by his wife. EMS  was called and as per patient and his wife his orthostatic BP was checked but they aren't sure what it was. Patient denies any chest pain, palpitations, SOB, headache or weakness of limbs. Denies any seizure like activity. He informs feeling little dizzy . He denies any bowel or urinary incontinence. Denies similar symptoms in past. Denies any bowel or urinary symptoms. informs having regular meals. His fsg when checked during these episodes were in high 200s . He informs having a beer last evening.denies any new mediations.   Physical Exam on Discharge:  Filed Vitals:   08/25/11 0600 08/25/11 1349 08/25/11 2100 08/26/11 0500  BP: 135/76 130/74 121/62 125/71  Pulse: 94 90 86 84  Temp: 98.2 F (36.8 C) 98 F (36.7 C) 98.7 F (37.1 C) 98.9 F (37.2 C)  TempSrc:  Oral Oral Oral  Resp: 18 19 18 18   Height:      Weight:      SpO2: 93% 97% 95% 95%     Intake/Output Summary (Last 24 hours) at 08/26/11 1344 Last data filed at 08/26/11 0900  Gross per 24 hour  Intake     920 ml  Output      0 ml  Net    920 ml      CBC:    Component Value Date/Time   WBC 12.7* 08/24/2011 1115   HGB 12.5* 08/24/2011 1115   HCT 35.7* 08/24/2011 1115   PLT 225 08/24/2011 1115   MCV 83.0 08/24/2011 1115   NEUTROABS 11.1* 08/24/2011 1115   LYMPHSABS 1.0 08/24/2011 1115   MONOABS 0.5 08/24/2011 1115   EOSABS 0.1 08/24/2011 1115   BASOSABS 0.0 08/24/2011 1115    Basic Metabolic Panel:    Component Value Date/Time   NA 134* 08/24/2011 1115   K 4.6 08/24/2011 1115   CL 100 08/24/2011 1115   CO2 26 08/24/2011 1115   BUN 14 08/24/2011 1115   CREATININE 0.73 08/24/2011 1115   GLUCOSE 351* 08/24/2011 1115   CALCIUM 9.0 08/24/2011 1115    Hospital Course:   Syncope and collapse  admitted  to telemetry  orthostatis negative  EKG shows RAD otherwise wnl, HR in low 100s on tele with few PVCs,  Cardiac enzyme negative  symptoms very likely due to dehydration or cardiogenic syncope.  Patient seen by cardiology consult ( Dr Mayford Knife) 2D echo shows normal LVEF and carotid doppler wnl  cardiology recommend to d/c home  On lifewatch heart monitor for 4 weeks and schedule outpatient stress to rule out ischemia .   Diabetes mellitus type 1  uncontrolled  on insulin pump  A1C of 9.8 this admission Cont statin   Hypertension  Stable  Patient will be dsicharged on life watch monitor for 4 weeks ( has been placed by cardiology prior to discharge) He will be called from cardiology clinic for outpatient follow up and stress test.   Time spent on Discharge: 45 minutes  Signed: [name] 08/26/2011, 1:44 PM

## 2011-08-26 NOTE — Progress Notes (Signed)
*  PRELIMINARY RESULTS* Vascular Ultrasound Carotid Duplex (Doppler) has been completed.  Preliminary findings: Bilaterally no significant ICA stenosis with antegrade vertebral flow.  Farrel Demark RDMS 08/26/2011, 12:50 PM

## 2014-03-01 ENCOUNTER — Ambulatory Visit (INDEPENDENT_AMBULATORY_CARE_PROVIDER_SITE_OTHER): Payer: BC Managed Care – PPO

## 2014-03-01 VITALS — BP 129/65 | HR 79 | Resp 17

## 2014-03-01 DIAGNOSIS — E1142 Type 2 diabetes mellitus with diabetic polyneuropathy: Secondary | ICD-10-CM

## 2014-03-01 DIAGNOSIS — L988 Other specified disorders of the skin and subcutaneous tissue: Secondary | ICD-10-CM

## 2014-03-01 DIAGNOSIS — E1140 Type 2 diabetes mellitus with diabetic neuropathy, unspecified: Secondary | ICD-10-CM

## 2014-03-01 DIAGNOSIS — R234 Changes in skin texture: Secondary | ICD-10-CM

## 2014-03-01 DIAGNOSIS — B353 Tinea pedis: Secondary | ICD-10-CM

## 2014-03-01 DIAGNOSIS — E1149 Type 2 diabetes mellitus with other diabetic neurological complication: Secondary | ICD-10-CM

## 2014-03-01 MED ORDER — SILVER SULFADIAZINE 1 % EX CREA: 1.0000 "application " | TOPICAL_CREAM | Freq: Every day | CUTANEOUS | Status: AC

## 2014-03-01 NOTE — Patient Instructions (Signed)
ANTIBACTERIAL SOAP INSTRUCTIONS  THE DAY AFTER PROCEDURE  Please follow the instructions your doctor has marked.   Shower as usual. Before getting out, place a drop of antibacterial liquid soap (Dial) on a wet, clean washcloth.  Gently wipe washcloth over affected area.  Afterward, rinse the area with warm water.  Blot the area dry with a soft cloth and cover with antibiotic ointment (neosporin, polysporin, bacitracin) and band aid or gauze and tape  Place 3-4 drops of antibacterial liquid soap in a quart of warm tap water.  Submerge foot into water for 20 minutes.  If bandage was applied after your procedure, leave on to allow for easy lift off, then remove and continue with soak for the remaining time.  Next, blot area dry with a soft cloth and cover with a bandage.  Apply other medications as directed by your doctor, such as cortisporin otic solution (eardrops) or neosporin antibiotic ointment  Wash or bathe the foot regularly with soap and water, also ulcer antibacterial. And afterwards apply either Neosporin or the prescribed Silvadene cream to the areas of fissuring skin  Also maintain twice daily application of Lotrimin cream to both feet

## 2014-03-01 NOTE — Progress Notes (Signed)
   Subjective:    Patient ID: Manuel PandaJohn K Sullivan, male    DOB: 08-19-1950, 63 y.o.   MRN: [id number]  HPI  pT PRESENTS WITH BILATERAL DRY SCALEY FEET, LEFT FOOT HAS CRACKED OPEN WOUND ON THE PLANTAR REGION, PT HAS BEEN TREATING WITH NEOSPORIN TID, AND LOTRIMIN AND AQUAPHOR BID WITH SOCKS, STATES THAT CRACKS IN FEET ARE PAINFUL AND KEEP HIM UP AT NIGHT, THEY HAVE PROGRESSIVELY GOTTEN WORSE. PT IS TYPE I AND TYPE II DIABETIC , HE IS ON A CONTINUOUS MONITORING SYSTEM WITH INSULIN PUMP TO CONTROL BLOOD GLUCOSE. HAS HAD  HOSPITALIZATION FOR DKA IN January 2015.   Review of Systems  Eyes: Positive for visual disturbance.  Skin: Positive for wound.  Neurological: Positive for numbness.  All other systems reviewed and are negative.      Objective:   Physical Exam 63 year old white male well-developed well-nourished oriented x3 presents at this time with a history of fissuring and an erythematous rash are peeling skin bottom of both feet left is fissuring the right has just peeling skin. Has been treating with Neosporin pain formula for the fissuring skin has been applying Lotrimin to the bottom of the right foot both feet of improved slightly he also using Aquaphor cream for the Morton for the dry skin. Although there is improvement not complete resolution at this time. Next the left lower extremity objective findings reveal neurovascular status is intact pedal pulses palpable DP postal for PT plus one over 4 capillary refill time 3 seconds epicritic and proprioceptive sensations intact and symmetric bilateral there is normal plantar response DTRs not elicited neurologically skin color pigment normal hair growth absent there is a fishing skin with a macular rash plantar aspect of both feet possibly consistent with a tinea pedis distribution. There is no interdigital fissuring of her patient is not having itching pruritus are that may be associated with a neuropathy reducing sensation. Orthopedic exam  otherwise unremarkable noncontributory no secondary infections no       Assessment & Plan:  Assessment this time is diabetes with history peripheral neuropathy possibly causing some dehydration fissuring the skin continue with Aquaphor cream also continued low wet Lotrimin cream to both feet notches the right foot. Will also use a spray antifungal for his shoes and socks as needed to help reduce reinfecting of fungus and suggested prescription for Silvadene cream to the fissuring skin to peel the small fissures on the bottom of the foot. Reappointed one month if it fails to resolve or improve contact us immediately if any exacerbation of symptoms.  Alvan Dameichard Leodis Alcocer DPM

## 2014-03-29 ENCOUNTER — Ambulatory Visit: Payer: BC Managed Care – PPO

## 2015-01-30 ENCOUNTER — Ambulatory Visit
Admission: RE | Admit: 2015-01-30 | Discharge: 2015-01-30 | Disposition: A | Discharge status: Home / Self Care | Payer: BLUE CROSS/BLUE SHIELD | Source: Ambulatory Visit | Attending: Physical Medicine and Rehabilitation | Admitting: Physical Medicine and Rehabilitation

## 2015-01-30 ENCOUNTER — Other Ambulatory Visit: Payer: Self-pay | Admitting: Physical Medicine and Rehabilitation

## 2015-01-30 DIAGNOSIS — G8929 Other chronic pain: Secondary | ICD-10-CM

## 2015-01-30 DIAGNOSIS — M545 Low back pain, unspecified: Secondary | ICD-10-CM

## 2015-02-17 ENCOUNTER — Ambulatory Visit
Admission: RE | Admit: 2015-02-17 | Discharge: 2015-02-17 | Disposition: A | Discharge status: Home / Self Care | Payer: BLUE CROSS/BLUE SHIELD | Source: Ambulatory Visit | Attending: Physical Medicine and Rehabilitation | Admitting: Physical Medicine and Rehabilitation

## 2015-02-17 ENCOUNTER — Other Ambulatory Visit: Payer: Self-pay | Admitting: Physical Medicine and Rehabilitation

## 2015-02-17 DIAGNOSIS — S93401A Sprain of unspecified ligament of right ankle, initial encounter: Secondary | ICD-10-CM

## 2015-08-08 ENCOUNTER — Encounter: Payer: Self-pay | Admitting: Podiatry

## 2015-08-08 ENCOUNTER — Ambulatory Visit (INDEPENDENT_AMBULATORY_CARE_PROVIDER_SITE_OTHER): Payer: BLUE CROSS/BLUE SHIELD | Admitting: Podiatry

## 2015-08-08 ENCOUNTER — Telehealth: Payer: Self-pay | Admitting: *Deleted

## 2015-08-08 VITALS — BP 141/61 | HR 84 | Resp 16

## 2015-08-08 DIAGNOSIS — R0989 Other specified symptoms and signs involving the circulatory and respiratory systems: Secondary | ICD-10-CM | POA: Diagnosis not present

## 2015-08-08 DIAGNOSIS — E1142 Type 2 diabetes mellitus with diabetic polyneuropathy: Secondary | ICD-10-CM

## 2015-08-08 NOTE — Patient Instructions (Signed)
Today your diabetic foot examination demonstrated decreased pulsations in the right and left feet. I am ordering a lower extremity arterial Doppler to evaluate this further and we will notify you of the results There is decreased feeling in your feet consistent with diabetic peripheral neuropathy Scaling on the bottom the right foot more consistent with dry skin and I recommend daily application of a moisturizing cream or ointment. Also, at night if the scaling increases use Saran wrap at a sock when sleeping and remove the following day. Repeat until scaling improves  Diabetes and Foot Care Diabetes may cause you to have problems because of poor blood supply (circulation) to your feet and legs. This may cause the skin on your feet to become thinner, break easier, and heal more slowly. Your skin may become dry, and the skin may peel and crack. You may also have nerve damage in your legs and feet causing decreased feeling in them. You may not notice minor injuries to your feet that could lead to infections or more serious problems. Taking care of your feet is one of the most important things you can do for yourself.  HOME CARE INSTRUCTIONS  Wear shoes at all times, even in the house. Do not go barefoot. Bare feet are easily injured.  Check your feet daily for blisters, cuts, and redness. If you cannot see the bottom of your feet, use a mirror or ask someone for help.  Wash your feet with warm water (do not use hot water) and mild soap. Then pat your feet and the areas between your toes until they are completely dry. Do not soak your feet as this can dry your skin.  Apply a moisturizing lotion or petroleum jelly (that does not contain alcohol and is unscented) to the skin on your feet and to dry, brittle toenails. Do not apply lotion between your toes.  Trim your toenails straight across. Do not dig under them or around the cuticle. File the edges of your nails with an emery board or nail file.  Do  not cut corns or calluses or try to remove them with medicine.  Wear clean socks or stockings every day. Make sure they are not too tight. Do not wear knee-high stockings since they may decrease blood flow to your legs.  Wear shoes that fit properly and have enough cushioning. To break in new shoes, wear them for just a few hours a day. This prevents you from injuring your feet. Always look in your shoes before you put them on to be sure there are no objects inside.  Do not cross your legs. This may decrease the blood flow to your feet.  If you find a minor scrape, cut, or break in the skin on your feet, keep it and the skin around it clean and dry. These areas may be cleansed with mild soap and water. Do not cleanse the area with peroxide, alcohol, or iodine.  When you remove an adhesive bandage, be sure not to damage the skin around it.  If you have a wound, look at it several times a day to make sure it is healing.  Do not use heating pads or hot water bottles. They may burn your skin. If you have lost feeling in your feet or legs, you may not know it is happening until it is too late.  Make sure your health care provider performs a complete foot exam at least annually or more often if you have foot problems. Report any  cuts, sores, or bruises to your health care provider immediately. SEEK MEDICAL CARE IF:   You have an injury that is not healing.  You have cuts or breaks in the skin.  You have an ingrown nail.  You notice redness on your legs or feet.  You feel burning or tingling in your legs or feet.  You have pain or cramps in your legs and feet.  Your legs or feet are numb.  Your feet always feel cold. SEEK IMMEDIATE MEDICAL CARE IF:   There is increasing redness, swelling, or pain in or around a wound.  There is a red line that goes up your leg.  Pus is coming from a wound.  You develop a fever or as directed by your health care provider.  You notice a bad smell  coming from an ulcer or wound.   This information is not intended to replace advice given to you by your health care provider. Make sure you discuss any questions you have with your health care provider.   Document Released: 07/05/2000 Document Revised: 03/10/2013 Document Reviewed: 12/15/2012 Elsevier Interactive Patient Education [employer]

## 2015-08-08 NOTE — Progress Notes (Signed)
   Subjective:    Patient ID: Manuel Sullivan, male    DOB: 03-Jun-1951, 65 y.o.   MRN: [id number]  HPI This patient presents today requesting a diabetic foot examination. He denies any history of claudication, ulceration or amputation. He relates a history of ongoing dry skin on the plantar aspect of the right foot which he applies daily moisturizing medication He Was last evaluated in office on 03/01/2014       Review of Systems  All other systems reviewed and are negative.      Objective:   Physical Exam  Orientated 3  Vascular: No calf pain or calf edema bilaterally DP pulses 1/4 bilaterally PT pulses 0/4 right 2/4 left Capillary reflex immediate bilaterally  Neurological: Sensation to 10 g monofilament wire intact 1/5 bilaterally Vibratory sensation nonreactive bilaterally Ankle reflex equal and reactive bilaterally  Dermatological No open skin lesions bilaterally Plantar scaling right foot without any inflammatory changes  Musculoskeletal: Hammertoe second right No restriction ankle, subtalar, midtarsal joints bilaterally      Assessment & Plan:   Assessment: Decrease pedal pulses Hammertoe second right Diabetic peripheral neuropathy Right dry scaling skin right  Plan: Today review the results of examination with patient today and made aware that his pedal pulses were diminished or difficult to palpate. I'm referring to the vascular lab for lower extremity arterial Doppler for the indication of diabetic with decreased pedal pulses  Patient also advised he has significant decrease sensation right and left feet Obtain moisturizing to dry skin on the plantar S right foot daily  Notify patient of results of lower extremity arterial Doppler

## 2015-08-08 NOTE — Telephone Encounter (Signed)
Dr. Leeanne Deed ordered ABI with TBI and arterial dopplers B/L diminished pulses.  Faxed to [facility].

## 2015-08-22 ENCOUNTER — Ambulatory Visit (HOSPITAL_COMMUNITY)
Admission: RE | Admit: 2015-08-22 | Discharge: 2015-08-22 | Disposition: A | Discharge status: Home / Self Care | Payer: BLUE CROSS/BLUE SHIELD | Source: Ambulatory Visit | Attending: Internal Medicine | Admitting: Internal Medicine

## 2015-08-22 DIAGNOSIS — I10 Essential (primary) hypertension: Secondary | ICD-10-CM | POA: Diagnosis not present

## 2015-08-22 DIAGNOSIS — E785 Hyperlipidemia, unspecified: Secondary | ICD-10-CM | POA: Insufficient documentation

## 2015-08-22 DIAGNOSIS — R0989 Other specified symptoms and signs involving the circulatory and respiratory systems: Secondary | ICD-10-CM | POA: Diagnosis not present

## 2015-08-22 DIAGNOSIS — E119 Type 2 diabetes mellitus without complications: Secondary | ICD-10-CM | POA: Insufficient documentation

## 2015-08-29 NOTE — Telephone Encounter (Addendum)
-----   Message from Carrington Clamp, DPM sent at 08/29/2015  8:23 AM EST ----- Lower extremity arterial Doppler dated 08/22/2015  No evidence of segmental lower extremity arterial disease at rest bilaterally  Notify patient vascular exam was within normal limits and needs no follow-up  08/29/2015-DR. TUCHMAN reviewed 08/22/2015 dopplers as within normal limits.  Left message informing pt of Dr. Theotis Burrow statement.

## 2016-07-03 IMAGING — CR DG ANKLE COMPLETE 3+V*R*
4 series · 4 of 4 positions shown · non-contrast
Comparison: None in PACs

CLINICAL DATA: Twisting injury of the right ankle 2 weeks ago with
persistent generalized ankle pain and swelling.

EXAM:
RIGHT ANKLE - COMPLETE 3+ VIEW

[t ankle joint ap right]
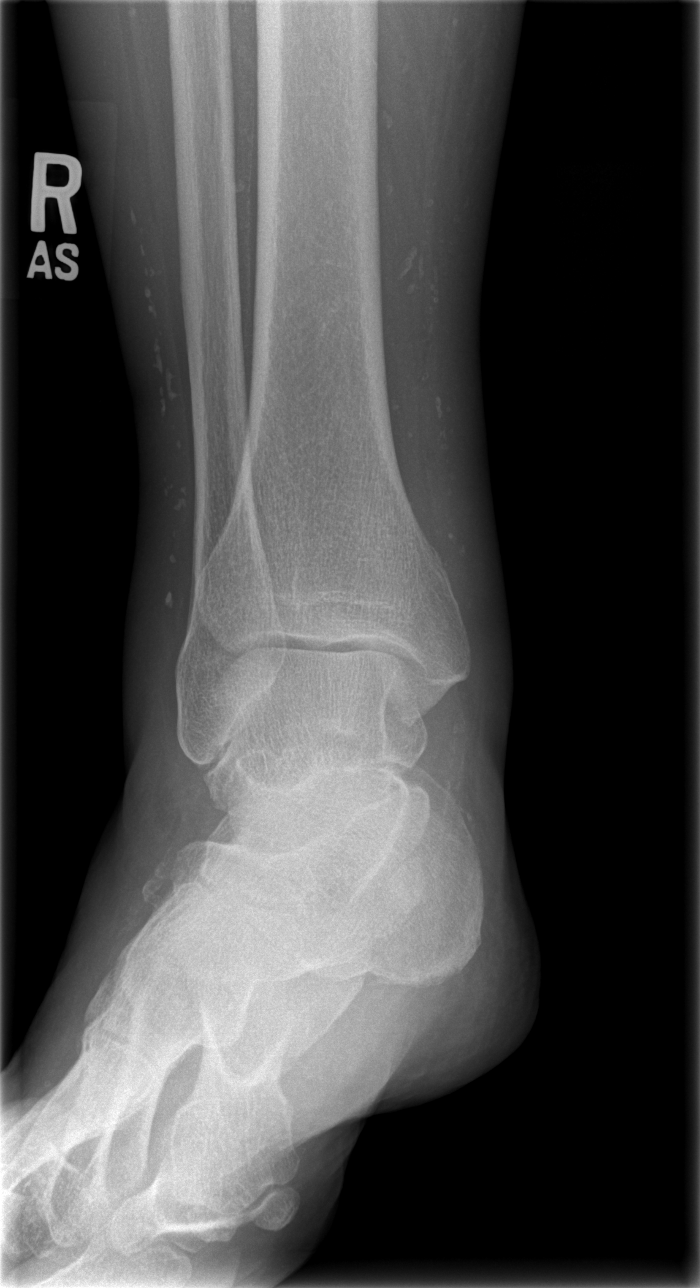

[t ankle joint oblique right (1 of 2)]
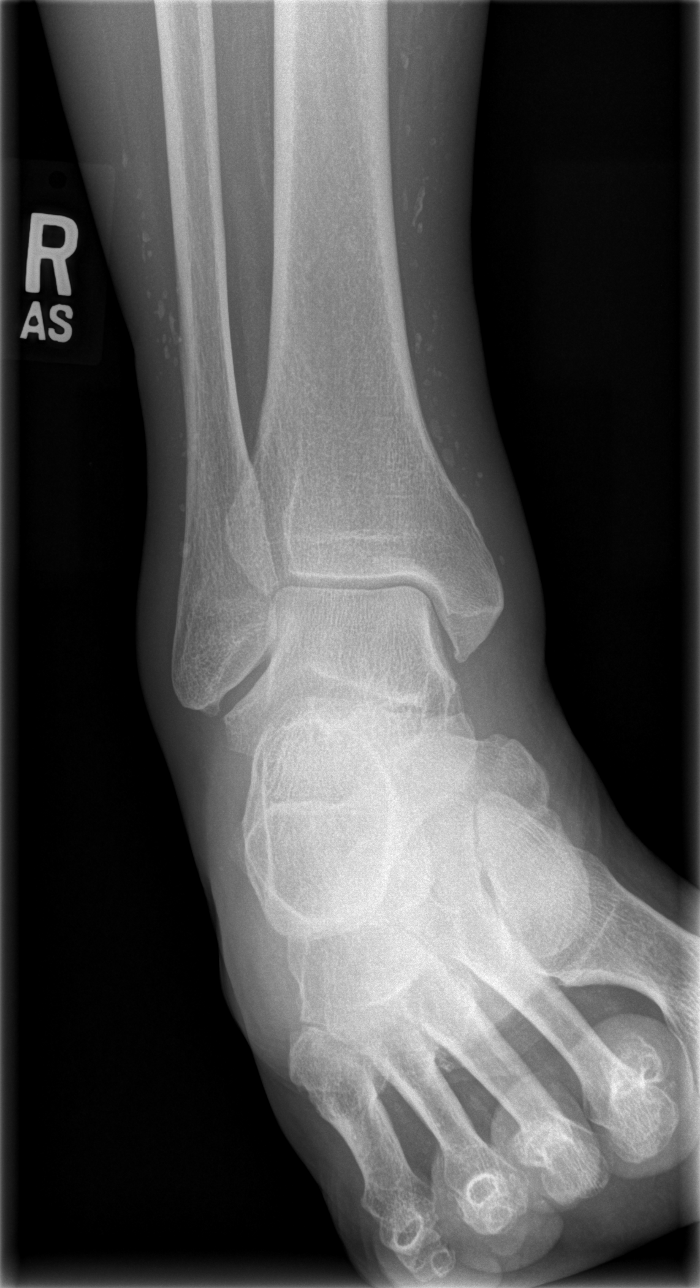

[t ankle joint lat right]
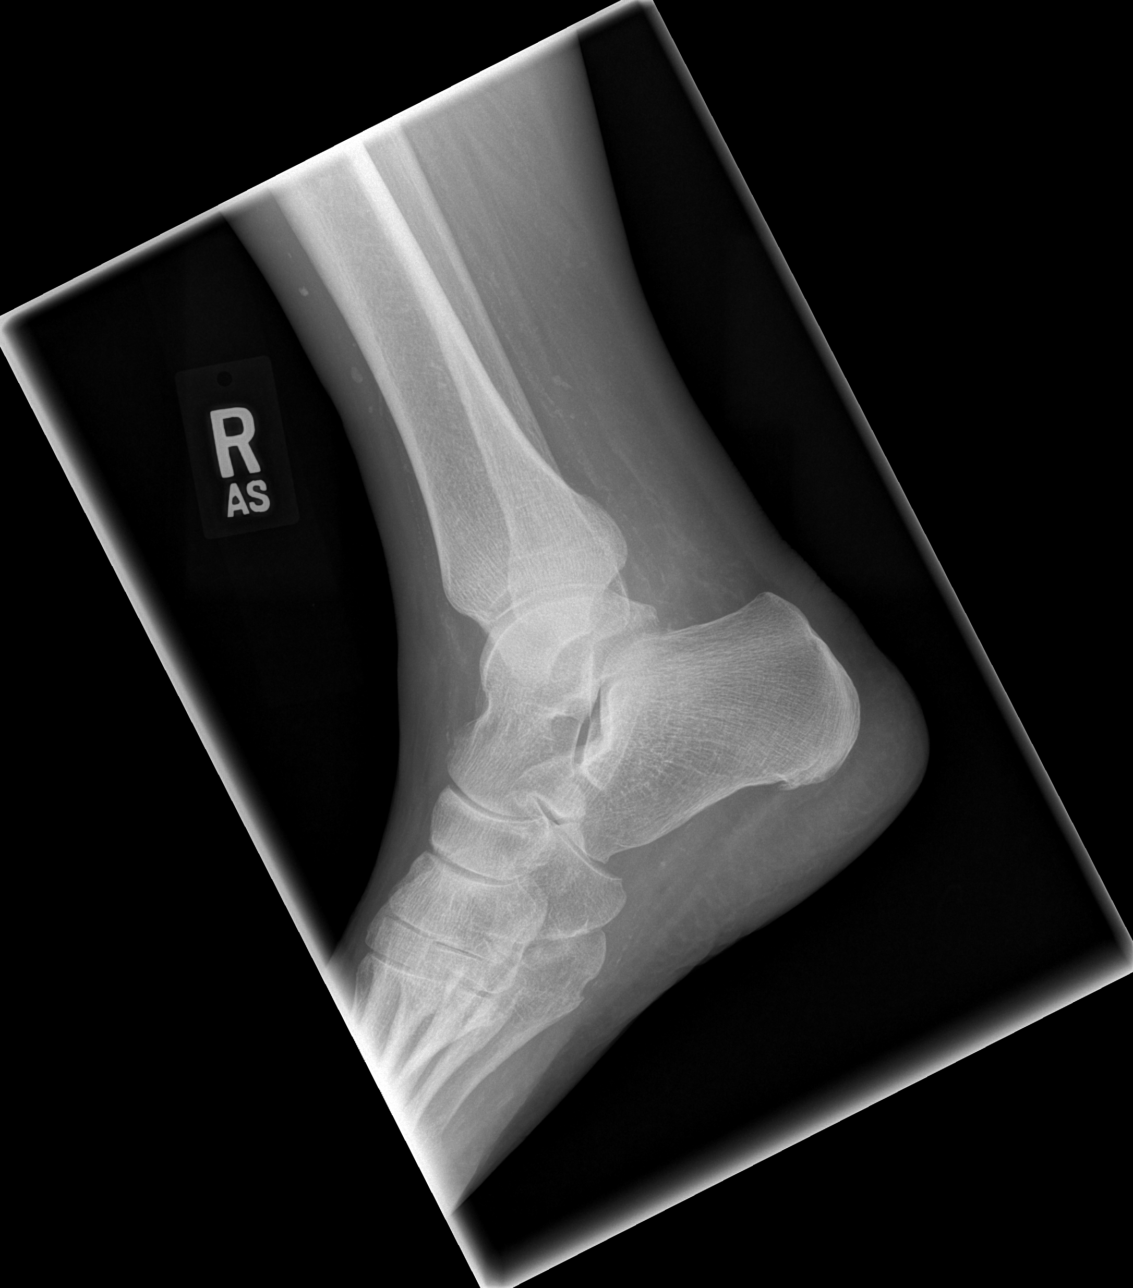

[t ankle joint oblique right (2 of 2)]
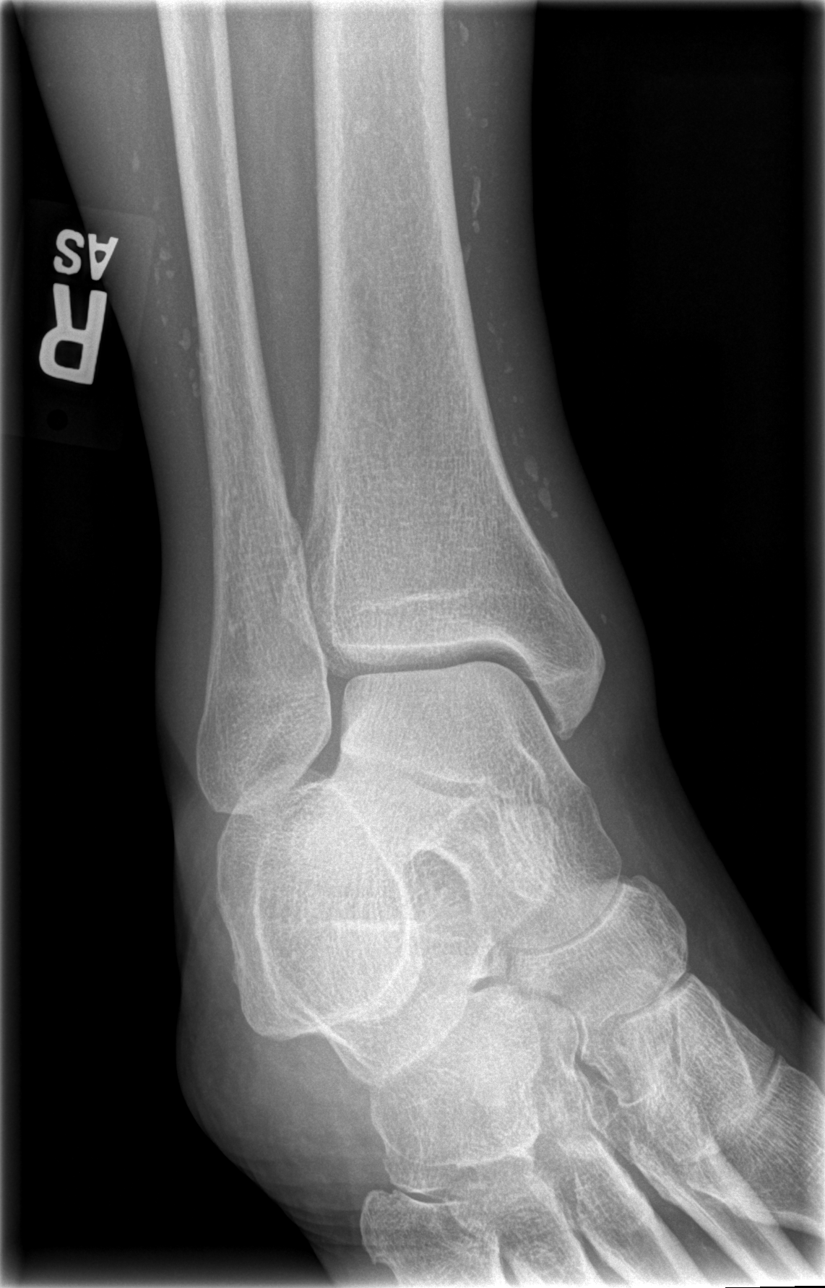

[4 of 4 positions shown; findings below may reference images not displayed]

FINDINGS: The ankle joint mortise is preserved. The talar dome is intact.
There is no acute malleolar fracture. There is no fracture evident
elsewhere. There is a small plantar calcaneal spur. There is mild
diffuse soft tissue swelling. There are phleboliths and arterial
calcifications.
IMPRESSION: There is no acute or significant chronic bony abnormality of the
right ankle.

## 2016-08-07 ENCOUNTER — Ambulatory Visit: Payer: BLUE CROSS/BLUE SHIELD | Admitting: Podiatry

## 2016-08-21 ENCOUNTER — Encounter: Payer: Self-pay | Admitting: Podiatry

## 2016-08-21 ENCOUNTER — Ambulatory Visit (INDEPENDENT_AMBULATORY_CARE_PROVIDER_SITE_OTHER): Payer: BLUE CROSS/BLUE SHIELD | Admitting: Podiatry

## 2016-08-21 VITALS — BP 147/72 | HR 92 | Resp 18

## 2016-08-21 DIAGNOSIS — L853 Xerosis cutis: Secondary | ICD-10-CM | POA: Diagnosis not present

## 2016-08-21 DIAGNOSIS — E1142 Type 2 diabetes mellitus with diabetic polyneuropathy: Secondary | ICD-10-CM

## 2016-08-21 NOTE — Progress Notes (Signed)
Patient ID: Manuel Sullivan, male   DOB: [birth year], 66 y.o.   MRN: [id number]   Subjective: This patient presents again for follow-up visit of 08/08/2015. At that time and today he again he still complains of a continuous dry scaling in intermittent fissuring on the plantar aspect of his right and left feet. Patient uses ongoing moisturizing creams or ointments and went fissures develop uses Neosporin ointment. Patient has used Tinactin spray for a long period of time. In spite of ongoing moisturizing and topical antifungal treatment the persistent scaling has continued. Patient is diabetic and denies any history of foot ulceration, claudication or amputation  Review of systems: Positive for dry scaling skin All other systems negative  Objective: Orientated 3 DP and PT pulses 1/4 bilaterally Capillary reflex immediate bilaterally  Neurological: Sensation to 10 g monofilament wire intact 0/5 right and 1/5 left Vibratory sensation reactive bilaterally Ankle reflexes reactive bilaterally  Dermatological: No open skin lesions bilaterally Plantar dry scaling erythematous skin bilaterally  Musculoskeletal: Hammertoe second bilaterally Manual motor testing dorsi flexion, plantar flexion, inversion, eversion 5/5 bilaterally  Assessment: Diabetic with peripheral neuropathy Diminished pedal pulses bilaterally Chronic scaling skin undetermined etiology  Plan: Today I reviewed the results with the patient today and told him that I could not give him a definitive cause of the chronic scaling it has not responded to daily moisturizing and topical antifungal medication. I instructed patient to contact a dermatologist to obtain a specific diagnosis and treatment for his ongoing chronic problem. In the meantime I encouraged patient to maintain moisturizing to his feet on a daily basis  Return as needed or yearly for diabetic foot screen

## 2016-08-21 NOTE — Patient Instructions (Signed)
Please consult with a dermatologist to obtain a definitive diagnosis for the chronic dry scaling skin that is not responding to continuous use of moisturizing agents and antifungal medication  Diabetes and Foot Care Diabetes may cause you to have problems because of poor blood supply (circulation) to your feet and legs. This may cause the skin on your feet to become thinner, break easier, and heal more slowly. Your skin may become dry, and the skin may peel and crack. You may also have nerve damage in your legs and feet causing decreased feeling in them. You may not notice minor injuries to your feet that could lead to infections or more serious problems. Taking care of your feet is one of the most important things you can do for yourself. Follow these instructions at home:  Wear shoes at all times, even in the house. Do not go barefoot. Bare feet are easily injured.  Check your feet daily for blisters, cuts, and redness. If you cannot see the bottom of your feet, use a mirror or ask someone for help.  Wash your feet with warm water (do not use hot water) and mild soap. Then pat your feet and the areas between your toes until they are completely dry. Do not soak your feet as this can dry your skin.  Apply a moisturizing lotion or petroleum jelly (that does not contain alcohol and is unscented) to the skin on your feet and to dry, brittle toenails. Do not apply lotion between your toes.  Trim your toenails straight across. Do not dig under them or around the cuticle. File the edges of your nails with an emery board or nail file.  Do not cut corns or calluses or try to remove them with medicine.  Wear clean socks or stockings every day. Make sure they are not too tight. Do not wear knee-high stockings since they may decrease blood flow to your legs.  Wear shoes that fit properly and have enough cushioning. To break in new shoes, wear them for just a few hours a day. This prevents you from injuring  your feet. Always look in your shoes before you put them on to be sure there are no objects inside.  Do not cross your legs. This may decrease the blood flow to your feet.  If you find a minor scrape, cut, or break in the skin on your feet, keep it and the skin around it clean and dry. These areas may be cleansed with mild soap and water. Do not cleanse the area with peroxide, alcohol, or iodine.  When you remove an adhesive bandage, be sure not to damage the skin around it.  If you have a wound, look at it several times a day to make sure it is healing.  Do not use heating pads or hot water bottles. They may burn your skin. If you have lost feeling in your feet or legs, you may not know it is happening until it is too late.  Make sure your health care provider performs a complete foot exam at least annually or more often if you have foot problems. Report any cuts, sores, or bruises to your health care provider immediately. Contact a health care provider if:  You have an injury that is not healing.  You have cuts or breaks in the skin.  You have an ingrown nail.  You notice redness on your legs or feet.  You feel burning or tingling in your legs or feet.  You have  pain or cramps in your legs and feet.  Your legs or feet are numb.  Your feet always feel cold. Get help right away if:  There is increasing redness, swelling, or pain in or around a wound.  There is a red line that goes up your leg.  Pus is coming from a wound.  You develop a fever or as directed by your health care provider.  You notice a bad smell coming from an ulcer or wound. This information is not intended to replace advice given to you by your health care provider. Make sure you discuss any questions you have with your health care provider. Document Released: 07/05/2000 Document Revised: 12/14/2015 Document Reviewed: 12/15/2012 Elsevier Interactive Patient Education  2017 [employer].

## 2017-08-21 ENCOUNTER — Ambulatory Visit: Payer: BLUE CROSS/BLUE SHIELD | Admitting: Podiatry

## 2017-09-03 ENCOUNTER — Encounter: Payer: Self-pay | Admitting: Podiatry

## 2017-09-03 ENCOUNTER — Ambulatory Visit: Payer: BLUE CROSS/BLUE SHIELD | Admitting: Podiatry

## 2017-09-03 DIAGNOSIS — L853 Xerosis cutis: Secondary | ICD-10-CM

## 2017-09-03 DIAGNOSIS — E1142 Type 2 diabetes mellitus with diabetic polyneuropathy: Secondary | ICD-10-CM | POA: Diagnosis not present

## 2017-09-03 DIAGNOSIS — M2040 Other hammer toe(s) (acquired), unspecified foot: Secondary | ICD-10-CM

## 2017-09-03 NOTE — Progress Notes (Signed)
Subjective:   Patient ID: Manuel PandaJohn K Sullivan, male   DOB: 67 y.o.   MRN: [id number]   HPI Long-term 55-year history diabetes is under reasonable control with profound neuropathy and hammertoe deformity with history of diabetic shoes   ROS      Objective:  Physical Exam  Neurovascular status unchanged from previous visit with patient having moderate vascular disease of which I questioned him extensively and he states that he has had vascular studies done and nothing has changed since     Assessment:  At risk diabetic with profound neuropathy vascular disease with digital deformities and history of diabetic shoes     Plan:  Discussed that we may at one point consider further vascular studies but since he is stable I do not recommend further that perspective and everything else looks good with no open ulcerations with hallux buckling bilateral.  We will get approval for new diabetic shoes for this year as they have helped to prevent problems and he has not developed any ulcerations

## 2017-09-09 ENCOUNTER — Other Ambulatory Visit: Payer: BLUE CROSS/BLUE SHIELD | Admitting: Orthotics

## 2017-09-16 ENCOUNTER — Ambulatory Visit (INDEPENDENT_AMBULATORY_CARE_PROVIDER_SITE_OTHER): Payer: BLUE CROSS/BLUE SHIELD | Admitting: Orthotics

## 2017-09-16 DIAGNOSIS — M79676 Pain in unspecified toe(s): Secondary | ICD-10-CM

## 2017-09-16 DIAGNOSIS — M2040 Other hammer toe(s) (acquired), unspecified foot: Secondary | ICD-10-CM

## 2017-09-16 DIAGNOSIS — E1142 Type 2 diabetes mellitus with diabetic polyneuropathy: Secondary | ICD-10-CM

## 2017-09-16 NOTE — Progress Notes (Signed)
Patient came in today for 2 pair of diabetic shoes (non medicare) and 1 pair of custom accomodative f/o.    Patient agrees to self pay 150.00 for shoes/each pair; also insurance to be filed for the L3020 accomodatives.

## 2017-10-21 ENCOUNTER — Telehealth: Payer: Self-pay | Admitting: Podiatry

## 2017-10-21 NOTE — Telephone Encounter (Signed)
Pt left voicemail that he got a bill for the diabetic shoes but has not received them yet. He is asking for status of them.   I returned call and left message, we do have the shoes just waiting on the inserts to come in and I will call when they do come in and he can just pay then.

## 2017-10-28 NOTE — Telephone Encounter (Signed)
Shoes and inserts in and pt notified and will call to schedule an appt when he is at his calendar.

## 2017-11-13 ENCOUNTER — Ambulatory Visit (INDEPENDENT_AMBULATORY_CARE_PROVIDER_SITE_OTHER): Payer: BLUE CROSS/BLUE SHIELD | Admitting: Orthotics

## 2017-11-13 DIAGNOSIS — E1142 Type 2 diabetes mellitus with diabetic polyneuropathy: Secondary | ICD-10-CM

## 2017-11-13 DIAGNOSIS — E109 Type 1 diabetes mellitus without complications: Secondary | ICD-10-CM

## 2017-11-14 NOTE — Progress Notes (Signed)

## 2020-04-04 ENCOUNTER — Other Ambulatory Visit: Payer: Self-pay

## 2020-04-04 ENCOUNTER — Ambulatory Visit (INDEPENDENT_AMBULATORY_CARE_PROVIDER_SITE_OTHER): Payer: Medicare Other | Admitting: Ophthalmology

## 2020-04-04 ENCOUNTER — Encounter (INDEPENDENT_AMBULATORY_CARE_PROVIDER_SITE_OTHER): Payer: Self-pay | Admitting: Ophthalmology

## 2020-04-04 DIAGNOSIS — Z961 Presence of intraocular lens: Secondary | ICD-10-CM | POA: Insufficient documentation

## 2020-04-04 DIAGNOSIS — H4720 Unspecified optic atrophy: Secondary | ICD-10-CM | POA: Diagnosis not present

## 2020-04-04 DIAGNOSIS — H401133 Primary open-angle glaucoma, bilateral, severe stage: Secondary | ICD-10-CM | POA: Diagnosis not present

## 2020-04-04 DIAGNOSIS — E113553 Type 2 diabetes mellitus with stable proliferative diabetic retinopathy, bilateral: Secondary | ICD-10-CM | POA: Diagnosis not present

## 2020-04-04 DIAGNOSIS — H47293 Other optic atrophy, bilateral: Secondary | ICD-10-CM | POA: Insufficient documentation

## 2020-04-04 DIAGNOSIS — Z794 Long term (current) use of insulin: Secondary | ICD-10-CM

## 2020-04-04 DIAGNOSIS — H3550 Unspecified hereditary retinal dystrophy: Secondary | ICD-10-CM

## 2020-04-04 NOTE — Assessment & Plan Note (Signed)
As per Dr Leonard Schwartz Lottie Dawson

## 2020-04-04 NOTE — Progress Notes (Signed)
04/04/2020     CHIEF COMPLAINT Patient presents for Retina Follow Up   HISTORY OF PRESENT ILLNESS: Manuel Sullivan is a 69 y.o. male who presents to the clinic today for:   HPI    Retina Follow Up    Patient presents with  Diabetic Retinopathy.  In both eyes.  This started 1 year ago.  Severity is mild.  Duration of 1 year.  Since onset it is gradually worsening.          Comments    1 Year Diabetic F/U OU  Pt c/o OS periphery getting worse. Pt denies any other new symptoms OU.  A1c: pt does not recall LBS: 182 in office this AM       Last edited by Rockie Neighbours, [place] on 04/04/2020  8:08 AM. (History)      Referring physician: Lorne Skeens, MD [address],  [place] [address]  HISTORICAL INFORMATION:   Selected notes from the MEDICAL RECORD NUMBER    Lab Results  Component Value Date   HGBA1C 9.8 (H) 08/24/2011     CURRENT MEDICATIONS: Current Outpatient Medications (Ophthalmic Drugs)  Medication Sig  . brimonidine (ALPHAGAN) 0.2 % ophthalmic solution Place 1 drop into both eyes 3 times daily.  . dorzolamide-timolol (COSOPT) 22.3-6.8 MG/ML ophthalmic solution Place 1 drop into both eyes 2 times daily.  . pilocarpine (PILOCAR) 1 % ophthalmic solution Place 1 drop into the right eye 3 times daily.  . brimonidine-timolol (COMBIGAN) 0.2-0.5 % ophthalmic solution Place 1 drop into both eyes every 12 (twelve) hours.  . brinzolamide (AZOPT) 1 % ophthalmic suspension Place 1 drop into the left eye 2 (two) times daily.  Marland Kitchen latanoprost (XALATAN) 0.005 % ophthalmic solution Place 1 drop into both eyes at bedtime.   No current facility-administered medications for this visit. (Ophthalmic Drugs)   Current Outpatient Medications (Other)  Medication Sig  . aspirin EC 81 MG tablet Take 81 mg by mouth daily.  Marland Kitchen atorvastatin (LIPITOR) 20 MG tablet Take 20 mg by mouth daily.  Marland Kitchen CIALIS 20 MG tablet   . esomeprazole (NEXIUM) 40 MG capsule Take 40 mg by  mouth every other day.  Marland Kitchen FARXIGA 10 MG TABS tablet   . insulin glulisine (APIDRA) 100 UNIT/ML injection Inject 1-16 Units into the skin 3 (three) times daily before meals. 65-75 units per day - on a sliding scale -  1 unit of insulin for every 25 points over 100.  . Multiple Vitamin (MULITIVITAMIN WITH MINERALS) TABS Take 1 tablet by mouth daily. Centrum silver  . Multiple Vitamins-Minerals (CENTRUM SILVER PO) Take by mouth.  Marland Kitchen NOVOLOG 100 UNIT/ML injection ADM UP TO 90 UNITS VIA INSULIN PUMP D UTD  . omega-3 acid ethyl esters (LOVAZA) 1 G capsule Take 1 g by mouth 2 (two) times daily.  . ONE TOUCH ULTRA TEST test strip USE TO CHECK BLOOD SUGAR 4 TIMES A DAY AS DIRECTED  . silver sulfADIAZINE (SILVADENE) 1 % cream Apply 1 application topically daily.  . sitaGLIPtan-metformin (JANUMET) 50-1000 MG per tablet Take 1 tablet by mouth 2 (two) times daily with a meal.  . UNABLE TO FIND glugagon kit  . valsartan (DIOVAN) 320 MG tablet Take 320 mg by mouth daily.  . Vitamin D, Ergocalciferol, (DRISDOL) 50000 UNITS CAPS Take 50,000 Units by mouth every 14 (fourteen) days. Dose due February 3rd   No current facility-administered medications for this visit. (Other)      REVIEW OF SYSTEMS:  ALLERGIES No Known Allergies  PAST MEDICAL HISTORY Past Medical History:  Diagnosis Date  . Diabetes mellitus   . Glaucoma   . Hyperlipemia   . Hypertension    History reviewed. No pertinent surgical history.  FAMILY HISTORY History reviewed. No pertinent family history.  SOCIAL HISTORY Social History   Tobacco Use  . Smoking status: Never Smoker  . Smokeless tobacco: Never Used  Substance Use Topics  . Alcohol use: No  . Drug use: No         OPHTHALMIC EXAM:  Base Eye Exam    Visual Acuity (ETDRS)      Right Left   Dist [place] 20/25 +1 20/25       Tonometry (Tonopen, 8:11 AM)      Right Left   Pressure 15 12       Pupils      Pupils Dark Light Shape React APD   Right PERRL 1  1 Round Minimal None   Left PERRL 2 1 Round Brisk None       Visual Fields (Counting fingers)      Left Right   Restrictions Total superior temporal, inferior temporal, superior nasal deficiencies Total superior temporal, inferior temporal, superior nasal, inferior nasal deficiencies       Extraocular Movement      Right Left    Full Full       Neuro/Psych    Oriented x3: Yes   Mood/Affect: Normal       Dilation    Both eyes: 1.0% Mydriacyl, 2.5% Phenylephrine @ 8:14 AM        Slit Lamp and Fundus Exam    External Exam      Right Left   External Normal Normal       Slit Lamp Exam      Right Left   Lids/Lashes Normal Normal   Conjunctiva/Sclera White and quiet White and quiet   Cornea Clear Clear   Anterior Chamber Deep and quiet Deep and quiet   Iris Round and reactive Round and reactive   Lens Centered posterior chamber intraocular lens Centered posterior chamber intraocular lens   Anterior Vitreous Normal Normal       Fundus Exam      Right Left   Posterior Vitreous Normal Normal   Disc Normal pallor   C/D Ratio 0.65 0.7   Macula Microaneurysms Microaneurysms   Vessels PDR-quiet PDR-quiet   Periphery RP, nearly wall-to-wall (done elsewhere) RP, nearly wall-to-wall (done elsewhere)          IMAGING AND PROCEDURES  Imaging and Procedures for 04/04/20  OCT, Retina - OU - Both Eyes       Right Eye Quality was good. Scan locations included subfoveal. Central Foveal Thickness: 299. Findings include abnormal foveal contour, outer retinal atrophy.   Left Eye Quality was good. Scan locations included subfoveal. Central Foveal Thickness: 312. Findings include abnormal foveal contour, outer retinal atrophy.   Notes No active maculopathy in either eye.  We will continue to observe, no CSME                ASSESSMENT/PLAN:  Primary open angle glaucoma of both eyes, severe stage As per Dr B Bond  Controlled type 2 diabetes mellitus with stable  proliferative retinopathy of both eyes, with long-term current use of insulin ([place]) The nature of regressed proliferative diabetic retinopathy was discussed with the patient. The patient was advised to maintain good glucose, blood pressure, monitor kidney function and serum lipid  control as advised by personal physician. Rare risk for reactivation of progression exist with untreated severe anemia, untreated renal failure, untreated heart failure, and smoking. Complete avoidance of smoking was recommended. The chance of recurrent proliferative diabetic retinopathy was discussed as well as the chance of vitreous hemorrhage for which further treatments may be necessary.   Explained to the patient that the quiescent  proliferative diabetic retinopathy disease is unlikely to ever worsen.  Worsening factors would include however severe anemia, hypertension out-of-control or impending renal failure.      ICD-10-CM   1. Controlled type 2 diabetes mellitus with stable proliferative retinopathy of both eyes, with long-term current use of insulin (HCC)  E11.3553 OCT, Retina - OU - Both Eyes   Z79.4   2. Optic atrophy associated with retinal dystrophies  H47.20    H35.50   3. Pseudophakia  Z96.1   4. Primary open angle glaucoma of both eyes, severe stage  H40.1133     1.  Follow-up with Dr. Dionne Ano as scheduled  2.  Diabetic retinopathy OU is quiescent  3.  Patient to return here as needed basis should new visual symptoms develop  Ophthalmic Meds Ordered this visit:  No orders of the defined types were placed in this encounter.      Return in about 1 year (around 04/04/2021) for DILATE OU.  There are no Patient Instructions on file for this visit.   Explained the diagnoses, plan, and follow up with the patient and they expressed understanding.  Patient expressed understanding of the importance of proper follow up care.   Clent Demark Ketara Cavness M.D. Diseases & Surgery of the Retina and  Vitreous Retina & Diabetic [place] 04/04/20     Abbreviations: M myopia (nearsighted); A astigmatism; H hyperopia (farsighted); P presbyopia; Mrx spectacle prescription;  CTL contact lenses; OD right eye; OS left eye; OU both eyes  XT exotropia; ET esotropia; PEK punctate epithelial keratitis; PEE punctate epithelial erosions; DES dry eye syndrome; MGD meibomian gland dysfunction; ATs artificial tears; PFAT's preservative free artificial tears; Mathews nuclear sclerotic cataract; PSC posterior subcapsular cataract; ERM epi-retinal membrane; PVD posterior vitreous detachment; RD retinal detachment; DM diabetes mellitus; DR diabetic retinopathy; NPDR non-proliferative diabetic retinopathy; PDR proliferative diabetic retinopathy; CSME clinically significant macular edema; DME diabetic macular edema; dbh dot blot hemorrhages; CWS cotton wool spot; POAG primary open angle glaucoma; C/D cup-to-disc ratio; HVF humphrey visual field; GVF goldmann visual field; OCT optical coherence tomography; IOP intraocular pressure; BRVO Branch retinal vein occlusion; CRVO central retinal vein occlusion; CRAO central retinal artery occlusion; BRAO branch retinal artery occlusion; RT retinal tear; SB scleral buckle; PPV pars plana vitrectomy; VH Vitreous hemorrhage; PRP panretinal laser photocoagulation; IVK intravitreal kenalog; VMT vitreomacular traction; MH Macular hole;  NVD neovascularization of the disc; NVE neovascularization elsewhere; AREDS age related eye disease study; ARMD age related macular degeneration; POAG primary open angle glaucoma; EBMD epithelial/anterior basement membrane dystrophy; ACIOL anterior chamber intraocular lens; IOL intraocular lens; PCIOL posterior chamber intraocular lens; Phaco/IOL phacoemulsification with intraocular lens placement; [place] photorefractive keratectomy; LASIK laser assisted in situ keratomileusis; HTN hypertension; DM diabetes mellitus; COPD chronic obstructive pulmonary disease

## 2020-04-04 NOTE — Assessment & Plan Note (Signed)

## 2021-04-09 ENCOUNTER — Ambulatory Visit (INDEPENDENT_AMBULATORY_CARE_PROVIDER_SITE_OTHER): Payer: Medicare Other | Admitting: Ophthalmology

## 2021-04-09 ENCOUNTER — Encounter (INDEPENDENT_AMBULATORY_CARE_PROVIDER_SITE_OTHER): Payer: Self-pay | Admitting: Ophthalmology

## 2021-04-09 ENCOUNTER — Other Ambulatory Visit: Payer: Self-pay

## 2021-04-09 DIAGNOSIS — E113553 Type 2 diabetes mellitus with stable proliferative diabetic retinopathy, bilateral: Secondary | ICD-10-CM

## 2021-04-09 DIAGNOSIS — H401133 Primary open-angle glaucoma, bilateral, severe stage: Secondary | ICD-10-CM | POA: Diagnosis not present

## 2021-04-09 DIAGNOSIS — Z794 Long term (current) use of insulin: Secondary | ICD-10-CM | POA: Diagnosis not present

## 2021-04-09 NOTE — Progress Notes (Signed)
04/09/2021     CHIEF COMPLAINT Patient presents for  Chief Complaint  Patient presents with   Retina Follow Up      HISTORY OF PRESENT ILLNESS: Manuel Sullivan is a 70 y.o. male who presents to the clinic today for:   HPI     Retina Follow Up   Patient presents with  Diabetic Retinopathy.  In both eyes.  This started 1 year ago.  Duration of 1 year.        Comments   1 year f/u OU with OCT  Pt c/o progressively decreasing peripheral vision over the past year. Pt states he recently visited Dr. Edilia Bo and performed a HVF. Pt states he is no longer driving at night time unless he absolutely has to, ~ 6 months since he decreased his night time driving. Pt denies any new flashes or floaters. Pt denies any eye pain.  Type 1 & 2 diabetic. Diagnosed 58 years ago. A1c: ~ 7.0  Eye Meds: Latanoprost QHS OU Dorz-Tim BID OU Pilocarpine BID OD Brimonidine BID OU      Last edited by Reather Littler, COA on 04/09/2021  8:16 AM.      Referring physician: Lorne Skeens, MD [address] [place],  [place] [address]  HISTORICAL INFORMATION:   Selected notes from the MEDICAL RECORD NUMBER    Lab Results  Component Value Date   HGBA1C 9.8 (H) 08/24/2011     CURRENT MEDICATIONS: Current Outpatient Medications (Ophthalmic Drugs)  Medication Sig   brimonidine-timolol (COMBIGAN) 0.2-0.5 % ophthalmic solution Place 1 drop into both eyes every 12 (twelve) hours.   brinzolamide (AZOPT) 1 % ophthalmic suspension Place 1 drop into the left eye 2 (two) times daily.   latanoprost (XALATAN) 0.005 % ophthalmic solution Place 1 drop into both eyes at bedtime.   pilocarpine (PILOCAR) 1 % ophthalmic solution Place 1 drop into the right eye 3 times daily.   No current facility-administered medications for this visit. (Ophthalmic Drugs)   Current Outpatient Medications (Other)  Medication Sig   aspirin EC 81 MG tablet Take 81 mg by mouth daily.   atorvastatin (LIPITOR) 20  MG tablet Take 20 mg by mouth daily.   CIALIS 20 MG tablet    esomeprazole (NEXIUM) 40 MG capsule Take 40 mg by mouth every other day.   FARXIGA 10 MG TABS tablet    insulin glulisine (APIDRA) 100 UNIT/ML injection Inject 1-16 Units into the skin 3 (three) times daily before meals. 65-75 units per day - on a sliding scale -  1 unit of insulin for every 25 points over 100.   Multiple Vitamin (MULITIVITAMIN WITH MINERALS) TABS Take 1 tablet by mouth daily. Centrum silver   Multiple Vitamins-Minerals (CENTRUM SILVER PO) Take by mouth.   NOVOLOG 100 UNIT/ML injection ADM UP TO 90 UNITS VIA INSULIN PUMP D UTD   omega-3 acid ethyl esters (LOVAZA) 1 G capsule Take 1 g by mouth 2 (two) times daily.   ONE TOUCH ULTRA TEST test strip USE TO CHECK BLOOD SUGAR 4 TIMES A DAY AS DIRECTED   silver sulfADIAZINE (SILVADENE) 1 % cream Apply 1 application topically daily.   sitaGLIPtan-metformin (JANUMET) 50-1000 MG per tablet Take 1 tablet by mouth 2 (two) times daily with a meal.   UNABLE TO FIND glugagon kit   valsartan (DIOVAN) 320 MG tablet Take 320 mg by mouth daily.   Vitamin D, Ergocalciferol, (DRISDOL) 50000 UNITS CAPS Take 50,000 Units by mouth every 14 (  fourteen) days. Dose due February 3rd   No current facility-administered medications for this visit. (Other)      REVIEW OF SYSTEMS:    ALLERGIES No Known Allergies  PAST MEDICAL HISTORY Past Medical History:  Diagnosis Date   Diabetes mellitus    Glaucoma    Hyperlipemia    Hypertension    History reviewed. No pertinent surgical history.  FAMILY HISTORY History reviewed. No pertinent family history.  SOCIAL HISTORY Social History   Tobacco Use   Smoking status: Never   Smokeless tobacco: Never  Substance Use Topics   Alcohol use: No   Drug use: No         OPHTHALMIC EXAM:  Base Eye Exam     Visual Acuity (ETDRS)       Right Left   Dist [place] 20/25 -2 20/30 -2   Dist ph [place]  20/30         Tonometry (Tonopen,  8:23 AM)       Right Left   Pressure 16 17         Pupils       Dark Light Shape React APD   Right 1 1 Round Minimal None   Left 2 2 Round Minimal None         Visual Fields       Left Right   Restrictions Total superior temporal, inferior temporal, superior nasal deficiencies Total superior temporal, inferior temporal, superior nasal, inferior nasal deficiencies         Extraocular Movement       Right Left    Full, Ortho Full, Ortho         Neuro/Psych     Oriented x3: Yes   Mood/Affect: Normal         Dilation     Both eyes: 1.0% Mydriacyl, 2.5% Phenylephrine @ 8:23 AM           Slit Lamp and Fundus Exam     External Exam       Right Left   External Normal Normal         Slit Lamp Exam       Right Left   Lids/Lashes Normal Normal   Conjunctiva/Sclera White and quiet White and quiet   Cornea Clear Clear   Anterior Chamber Deep and quiet Deep and quiet   Iris Round and reactive Round and reactive   Lens Centered posterior chamber intraocular lens Centered posterior chamber intraocular lens   Anterior Vitreous Normal Normal         Fundus Exam       Right Left   Posterior Vitreous Normal Normal   Disc pallor 1+ pallor   C/D Ratio 0.65 0.7   Macula Microaneurysms Microaneurysms   Vessels PDR-quiet PDR-quiet   Periphery PRP, nearly wall-to-wall (done elsewhere) PRP, nearly wall-to-wall (done elsewhere)            IMAGING AND PROCEDURES  Imaging and Procedures for 04/09/21  OCT, Retina - OU - Both Eyes       Right Eye Quality was good. Scan locations included subfoveal. Central Foveal Thickness: 297. Findings include abnormal foveal contour, outer retinal atrophy.   Left Eye Quality was good. Scan locations included subfoveal. Central Foveal Thickness: 306. Findings include abnormal foveal contour, outer retinal atrophy.   Notes No active maculopathy in either eye.  We will continue to observe, no CSME              ASSESSMENT/PLAN:  Primary  open angle glaucoma of both eyes, severe stage Follow-up with Dr. Dionne Ano as scheduled  Controlled type 2 diabetes mellitus with stable proliferative retinopathy of both eyes, with long-term current use of insulin (Trainer) Bilateral quiescent PDR.  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  No active maculopathy by OCT or examination  Thin attenuated retinal arteries from previous PDR as well as its treatment, contributing to poor optic nerve perfusion, as well as vasculature proximally as well from his diabetic vascular disease     ICD-10-CM   1. Controlled type 2 diabetes mellitus with stable proliferative retinopathy of both eyes, with long-term current use of insulin (HCC)  E11.3553 OCT, Retina - OU - Both Eyes   Z79.4     2. Primary open angle glaucoma of both eyes, severe stage  H40.1133       1.  Good acuity maintained yet patient does report some dimming of the vision.  I explained to him that this is most likely combination of neurological vasculature proximal to the optic nerves contributing optic atrophy.  More over lesion creep leading to dimness overall of peripheral vision is perhaps leading to this sense of dimming vision   2.  Follow Dr. Dionne Ano as scheduled for glaucoma management and control  3.  Ophthalmic Meds Ordered this visit:  No orders of the defined types were placed in this encounter.      Return in about 1 year (around 04/09/2022) for DILATE OU, OCT, COLOR FP.  There are no Patient Instructions on file for this visit.   Explained the diagnoses, plan, and follow up with the patient and they expressed understanding.  Patient expressed understanding of the importance of proper follow up care.   Clent Demark Fredrika Canby  M.D. Diseases & Surgery of the Retina and Vitreous Retina & Diabetic [place] 04/09/21     Abbreviations: M myopia (nearsighted); A astigmatism; H hyperopia (farsighted); P presbyopia; Mrx spectacle prescription;  CTL contact lenses; OD right eye; OS left eye; OU both eyes  XT exotropia; ET esotropia; PEK punctate epithelial keratitis; PEE punctate epithelial erosions; DES dry eye syndrome; MGD meibomian gland dysfunction; ATs artificial tears; PFAT's preservative free artificial tears; [place] nuclear sclerotic cataract; PSC posterior subcapsular cataract; ERM epi-retinal membrane; PVD posterior vitreous detachment; RD retinal detachment; DM diabetes mellitus; DR diabetic retinopathy; NPDR non-proliferative diabetic retinopathy; PDR proliferative diabetic retinopathy; CSME clinically significant macular edema; DME diabetic macular edema; dbh dot blot hemorrhages; CWS cotton wool spot; POAG primary open angle glaucoma; C/D cup-to-disc ratio; HVF humphrey visual field; GVF goldmann visual field; OCT optical coherence tomography; IOP intraocular pressure; BRVO Branch retinal vein occlusion; CRVO central retinal vein occlusion; CRAO central retinal artery occlusion; BRAO branch retinal artery occlusion; RT retinal tear; SB scleral buckle; PPV pars plana vitrectomy; VH Vitreous hemorrhage; PRP panretinal laser photocoagulation; IVK intravitreal kenalog; VMT vitreomacular traction; MH Macular hole;  NVD neovascularization of the disc; NVE neovascularization elsewhere; AREDS age related eye disease study; ARMD age related macular degeneration; POAG primary open angle glaucoma; EBMD epithelial/anterior basement membrane dystrophy; ACIOL anterior chamber intraocular lens; IOL intraocular lens; PCIOL posterior chamber intraocular lens; Phaco/IOL phacoemulsification with intraocular lens placement; [place] photorefractive keratectomy; LASIK laser assisted in situ keratomileusis; HTN hypertension; DM diabetes mellitus; COPD  chronic obstructive pulmonary disease

## 2021-04-09 NOTE — Assessment & Plan Note (Signed)
Follow-up with Dr. Brent Bond as scheduled 

## 2021-04-09 NOTE — Assessment & Plan Note (Signed)
Bilateral quiescent PDR.  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  No active maculopathy by OCT or examination  Thin attenuated retinal arteries from previous PDR as well as its treatment, contributing to poor optic nerve perfusion, as well as vasculature proximally as well from his diabetic vascular disease

## 2021-05-07 ENCOUNTER — Encounter (INDEPENDENT_AMBULATORY_CARE_PROVIDER_SITE_OTHER): Payer: Self-pay

## 2021-09-11 ENCOUNTER — Encounter (INDEPENDENT_AMBULATORY_CARE_PROVIDER_SITE_OTHER): Payer: Self-pay

## 2022-03-14 ENCOUNTER — Encounter (INDEPENDENT_AMBULATORY_CARE_PROVIDER_SITE_OTHER): Payer: Self-pay

## 2022-04-09 ENCOUNTER — Ambulatory Visit (INDEPENDENT_AMBULATORY_CARE_PROVIDER_SITE_OTHER): Payer: Medicare Other | Admitting: Ophthalmology

## 2022-04-09 ENCOUNTER — Encounter (INDEPENDENT_AMBULATORY_CARE_PROVIDER_SITE_OTHER): Payer: Self-pay | Admitting: Ophthalmology

## 2022-04-09 DIAGNOSIS — Z794 Long term (current) use of insulin: Secondary | ICD-10-CM | POA: Diagnosis not present

## 2022-04-09 DIAGNOSIS — E113553 Type 2 diabetes mellitus with stable proliferative diabetic retinopathy, bilateral: Secondary | ICD-10-CM | POA: Diagnosis not present

## 2022-04-09 DIAGNOSIS — H401133 Primary open-angle glaucoma, bilateral, severe stage: Secondary | ICD-10-CM | POA: Diagnosis not present

## 2022-04-09 DIAGNOSIS — H353133 Nonexudative age-related macular degeneration, bilateral, advanced atrophic without subfoveal involvement: Secondary | ICD-10-CM | POA: Insufficient documentation

## 2022-04-09 NOTE — Assessment & Plan Note (Signed)
Lesion creep geographic type atrophy.  We will continue to observe

## 2022-04-09 NOTE — Assessment & Plan Note (Signed)
Under the care of of [facility]

## 2022-04-09 NOTE — Assessment & Plan Note (Signed)

## 2022-04-09 NOTE — Progress Notes (Signed)
04/09/2022     CHIEF COMPLAINT Patient presents for  Chief Complaint  Patient presents with   Retina Evaluation   Diabetic Retinopathy without Macular Edema      HISTORY OF PRESENT ILLNESS: Manuel Sullivan is a 71 y.o. male who presents to the clinic today for:   HPI     Retina Evaluation           Laterality: both eyes         Comments   Stable proliferative retinopathy of both eyes 1 year fu ou oct fp Pt states his vision has not been stable Pt denies any new floaters or FOL Pt states he notices his distant vision is not that good.      Last edited by Edmon Crape, MD on 04/09/2022  9:19 AM.      Referring physician: [place], Casimiro Needle, MD No address on file  HISTORICAL INFORMATION:   Selected notes from the MEDICAL RECORD NUMBER    Lab Results  Component Value Date   HGBA1C 9.8 (H) 08/24/2011     CURRENT MEDICATIONS: Current Outpatient Medications (Ophthalmic Drugs)  Medication Sig   brimonidine-timolol (COMBIGAN) 0.2-0.5 % ophthalmic solution Place 1 drop into both eyes every 12 (twelve) hours.   brinzolamide (AZOPT) 1 % ophthalmic suspension Place 1 drop into the left eye 2 (two) times daily.   latanoprost (XALATAN) 0.005 % ophthalmic solution Place 1 drop into both eyes at bedtime.   pilocarpine (PILOCAR) 1 % ophthalmic solution Place 1 drop into the right eye 3 times daily.   No current facility-administered medications for this visit. (Ophthalmic Drugs)   Current Outpatient Medications (Other)  Medication Sig   aspirin EC 81 MG tablet Take 81 mg by mouth daily.   atorvastatin (LIPITOR) 20 MG tablet Take 20 mg by mouth daily.   CIALIS 20 MG tablet    esomeprazole (NEXIUM) 40 MG capsule Take 40 mg by mouth every other day.   FARXIGA 10 MG TABS tablet    insulin glulisine (APIDRA) 100 UNIT/ML injection Inject 1-16 Units into the skin 3 (three) times daily before meals. 65-75 units per day - on a sliding scale -  1 unit of insulin for  every 25 points over 100.   Multiple Vitamin (MULITIVITAMIN WITH MINERALS) TABS Take 1 tablet by mouth daily. Centrum silver   Multiple Vitamins-Minerals (CENTRUM SILVER PO) Take by mouth.   NOVOLOG 100 UNIT/ML injection ADM UP TO 90 UNITS VIA INSULIN PUMP D UTD   omega-3 acid ethyl esters (LOVAZA) 1 G capsule Take 1 g by mouth 2 (two) times daily.   ONE TOUCH ULTRA TEST test strip USE TO CHECK BLOOD SUGAR 4 TIMES A DAY AS DIRECTED   silver sulfADIAZINE (SILVADENE) 1 % cream Apply 1 application topically daily.   sitaGLIPtan-metformin (JANUMET) 50-1000 MG per tablet Take 1 tablet by mouth 2 (two) times daily with a meal.   UNABLE TO FIND glugagon kit   valsartan (DIOVAN) 320 MG tablet Take 320 mg by mouth daily.   Vitamin D, Ergocalciferol, (DRISDOL) 50000 UNITS CAPS Take 50,000 Units by mouth every 14 (fourteen) days. Dose due February 3rd   No current facility-administered medications for this visit. (Other)      REVIEW OF SYSTEMS: ROS   Negative for: Constitutional, Gastrointestinal, Neurological, Skin, Genitourinary, Musculoskeletal, HENT, Endocrine, Cardiovascular, Eyes, Respiratory, Psychiatric, Allergic/Imm, Heme/Lymph Last edited by Aleene Davidson, CMA on 04/09/2022  8:27 AM.       ALLERGIES No Known Allergies  PAST MEDICAL HISTORY Past Medical History:  Diagnosis Date   Diabetes mellitus    Glaucoma    Hyperlipemia    Hypertension    History reviewed. No pertinent surgical history.  FAMILY HISTORY History reviewed. No pertinent family history.  SOCIAL HISTORY Social History   Tobacco Use   Smoking status: Never   Smokeless tobacco: Never  Substance Use Topics   Alcohol use: No   Drug use: No         OPHTHALMIC EXAM:  Base Eye Exam     Visual Acuity (ETDRS)       Right Left   Dist [place] 20/20 -2 20/30 -1         Tonometry (Tonopen, 8:34 AM)       Right Left   Pressure 7 9         Visual Fields       Left Right    Full Full          Extraocular Movement       Right Left    Ortho Ortho    -- -- --  --  --  -- -- --   -- -- --  --  --  -- -- --           Neuro/Psych     Oriented x3: Yes   Mood/Affect: Normal         Dilation     Both eyes: 1.0% Mydriacyl, 2.5% Phenylephrine @ 8:28 AM           Slit Lamp and Fundus Exam     External Exam       Right Left   External Normal Normal         Slit Lamp Exam       Right Left   Lids/Lashes Normal Normal   Conjunctiva/Sclera White and quiet White and quiet   Cornea Clear Clear   Anterior Chamber Deep and quiet Deep and quiet   Iris Round and reactive Round and reactive   Lens Centered posterior chamber intraocular lens Centered posterior chamber intraocular lens   Anterior Vitreous Normal Normal         Fundus Exam       Right Left   Posterior Vitreous Normal Normal   Disc pallor 1+ pallor   C/D Ratio 0.65 0.7   Macula Microaneurysms, no clinically significant macular edema Microaneurysms, Geographic atrophy encroaching from superotemporal aspect of macula near FAZ, but likely "lesion creep" of geographic atrophy and prior focal laser treatment performed decades previous   Vessels PDR-quiet PDR-quiet   Periphery PRP, nearly wall-to-wall (done elsewhere in decade of 70's) PRP, nearly wall-to-wall (done elsewhere in decade of 70's)            IMAGING AND PROCEDURES  Imaging and Procedures for 04/09/22  OCT, Retina - OU - Both Eyes       Right Eye Quality was good. Scan locations included subfoveal. Central Foveal Thickness: 297. Findings include abnormal foveal contour, outer retinal atrophy.   Left Eye Quality was good. Scan locations included subfoveal. Central Foveal Thickness: 301. Findings include abnormal foveal contour, outer retinal atrophy.   Notes No active maculopathy in either eye.  We will continue to observe, no CSME     Color Fundus Photography Optos - OU - Both Eyes       Right Eye Progression  has been stable. Disc findings include increased cup to disc ratio, pallor. Macula : geographic atrophy.   Left  Eye Progression has been stable. Disc findings include increased cup to disc ratio, pallor. Macula : geographic atrophy.   Notes Good peripheral PRP, wall-to-wall.  Formed in the 70s.  Elsewhere.             ASSESSMENT/PLAN:  Controlled type 2 diabetes mellitus with stable proliferative retinopathy of both eyes, with long-term current use of insulin (HCC) The nature of regressed proliferative diabetic retinopathy was discussed with the patient. The patient was advised to maintain good glucose, blood pressure, monitor kidney function and serum lipid control as advised by personal physician. Rare risk for reactivation of progression exist with untreated severe anemia, untreated renal failure, untreated heart failure, and smoking. Complete avoidance of smoking was recommended. The chance of recurrent proliferative diabetic retinopathy was discussed as well as the chance of vitreous hemorrhage for which further treatments may be necessary.   Explained to the patient that the quiescent  proliferative diabetic retinopathy disease is unlikely to ever worsen.  Worsening factors would include however severe anemia, hypertension out-of-control or impending renal failure.  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  We will continue to observe     ICD-10-CM   1. Controlled type 2 diabetes mellitus with stable proliferative retinopathy of both eyes, with long-term current use of insulin (HCC)  E11.3553 OCT, Retina - OU - Both Eyes   Z79.4 Color Fundus Photography Optos - OU - Both Eyes    2. Primary open angle glaucoma of both eyes, severe stage  H40.1133     3. Advanced nonexudative age-related macular  degeneration of both eyes without subfoveal involvement  H35.3133       OU stable quiescent PDR.  2.  OU with good lower IOP, under the direction of Dr. Dionne Ano for [facility].  3.  Ophthalmic Meds Ordered this visit:  No orders of the defined types were placed in this encounter.      Return in about 1 year (around 04/10/2023) for DILATE OU, COLOR FP, OCT.  There are no Patient Instructions on file for this visit.   Explained the diagnoses, plan, and follow up with the patient and they expressed understanding.  Patient expressed understanding of the importance of proper follow up care.   Clent Demark Ashlyne Olenick M.D. Diseases & Surgery of the Retina and Vitreous Retina & Diabetic [place] 04/09/22     Abbreviations: M myopia (nearsighted); A astigmatism; H hyperopia (farsighted); P presbyopia; Mrx spectacle prescription;  CTL contact lenses; OD right eye; OS left eye; OU both eyes  XT exotropia; ET esotropia; PEK punctate epithelial keratitis; PEE punctate epithelial erosions; DES dry eye syndrome; MGD meibomian gland dysfunction; ATs artificial tears; PFAT's preservative free artificial tears; [place] nuclear sclerotic cataract; PSC posterior subcapsular cataract; ERM epi-retinal membrane; PVD posterior vitreous detachment; RD retinal detachment; DM diabetes mellitus; DR diabetic retinopathy; NPDR non-proliferative diabetic retinopathy; PDR proliferative diabetic retinopathy; CSME clinically significant macular edema; DME diabetic macular edema; dbh dot blot hemorrhages; CWS cotton wool spot; POAG primary open angle glaucoma; C/D cup-to-disc ratio; HVF humphrey visual field; GVF goldmann visual field; OCT optical coherence tomography; IOP intraocular pressure; BRVO Branch retinal vein occlusion; CRVO central retinal vein occlusion; CRAO central retinal artery occlusion; BRAO branch retinal artery occlusion; RT retinal tear; SB scleral buckle; PPV pars plana vitrectomy; VH Vitreous hemorrhage; PRP  panretinal laser photocoagulation; IVK intravitreal kenalog;  VMT vitreomacular traction; MH Macular hole;  NVD neovascularization of the disc; NVE neovascularization elsewhere; AREDS age related eye disease study; ARMD age related macular degeneration; POAG primary open angle glaucoma; EBMD epithelial/anterior basement membrane dystrophy; ACIOL anterior chamber intraocular lens; IOL intraocular lens; PCIOL posterior chamber intraocular lens; Phaco/IOL phacoemulsification with intraocular lens placement; PRK photorefractive keratectomy; LASIK laser assisted in situ keratomileusis; HTN hypertension; DM diabetes mellitus; COPD chronic obstructive pulmonary disease 

## 2023-04-14 ENCOUNTER — Encounter (INDEPENDENT_AMBULATORY_CARE_PROVIDER_SITE_OTHER): Payer: Medicare Other | Admitting: Ophthalmology

## 2023-08-25 ENCOUNTER — Emergency Department (HOSPITAL_BASED_OUTPATIENT_CLINIC_OR_DEPARTMENT_OTHER): Payer: Medicare Other

## 2023-08-25 ENCOUNTER — Other Ambulatory Visit: Payer: Self-pay

## 2023-08-25 ENCOUNTER — Encounter (HOSPITAL_BASED_OUTPATIENT_CLINIC_OR_DEPARTMENT_OTHER): Payer: Self-pay | Admitting: Urology

## 2023-08-25 ENCOUNTER — Emergency Department (HOSPITAL_BASED_OUTPATIENT_CLINIC_OR_DEPARTMENT_OTHER)
Admission: EM | Admit: 2023-08-25 | Discharge: 2023-08-25 | Discharge status: Against Medical Advice | Payer: Medicare Other | Attending: Emergency Medicine | Admitting: Emergency Medicine

## 2023-08-25 DIAGNOSIS — Z5321 Procedure and treatment not carried out due to patient leaving prior to being seen by health care provider: Secondary | ICD-10-CM | POA: Insufficient documentation

## 2023-08-25 DIAGNOSIS — M79604 Pain in right leg: Secondary | ICD-10-CM | POA: Diagnosis present

## 2023-08-25 NOTE — ED Triage Notes (Signed)
Pt concern for DVT of right calf area, pcp sent to r/o  Redness and swelling x 1 week  No h/o blood clots
